# Patient Record
Sex: Female | Born: 1990 | Hispanic: No | Marital: Single | Smoking: Former smoker
Health system: Southern US, Community
[De-identification: ages and names within clinical notes are randomized; demographics above are authoritative.]

## PROBLEM LIST (undated history)

## (undated) DIAGNOSIS — M069 Rheumatoid arthritis, unspecified: Secondary | ICD-10-CM

## (undated) DIAGNOSIS — M3214 Glomerular disease in systemic lupus erythematosus: Secondary | ICD-10-CM

## (undated) DIAGNOSIS — N08 Glomerular disorders in diseases classified elsewhere: Secondary | ICD-10-CM

## (undated) DIAGNOSIS — M329 Systemic lupus erythematosus, unspecified: Secondary | ICD-10-CM

## (undated) DIAGNOSIS — IMO0002 Reserved for concepts with insufficient information to code with codable children: Secondary | ICD-10-CM

## (undated) DIAGNOSIS — M3210 Systemic lupus erythematosus, organ or system involvement unspecified: Secondary | ICD-10-CM

## (undated) HISTORY — DX: Glomerular disease in systemic lupus erythematosus: M32.14

---
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
## 2011-01-09 ENCOUNTER — Telehealth: Payer: Self-pay | Admitting: Internal Medicine

## 2011-01-09 ENCOUNTER — Other Ambulatory Visit: Payer: Self-pay | Admitting: Internal Medicine

## 2011-01-09 DIAGNOSIS — G43909 Migraine, unspecified, not intractable, without status migrainosus: Secondary | ICD-10-CM

## 2011-01-09 MED ORDER — PROPRANOLOL HCL 60 MG PO CP24: 60.0000 mg | ORAL_CAPSULE | Freq: Every day | ORAL | Status: DC

## 2011-01-09 NOTE — Telephone Encounter (Signed)
A user error has taken place: encounter opened in error, closed for administrative reasons.

## 2011-02-11 ENCOUNTER — Ambulatory Visit: Payer: Self-pay | Admitting: Neurology

## 2011-03-27 ENCOUNTER — Ambulatory Visit: Payer: BC Managed Care – PPO | Admitting: Internal Medicine

## 2011-04-01 ENCOUNTER — Ambulatory Visit: Payer: BC Managed Care – PPO | Admitting: Internal Medicine

## 2011-04-01 DIAGNOSIS — Z0289 Encounter for other administrative examinations: Secondary | ICD-10-CM

## 2011-05-08 ENCOUNTER — Ambulatory Visit (INDEPENDENT_AMBULATORY_CARE_PROVIDER_SITE_OTHER): Payer: Self-pay | Admitting: Internal Medicine

## 2011-05-08 ENCOUNTER — Encounter: Payer: Self-pay | Admitting: Internal Medicine

## 2011-05-08 DIAGNOSIS — F411 Generalized anxiety disorder: Secondary | ICD-10-CM

## 2011-05-08 MED ORDER — ALPRAZOLAM 0.25 MG PO TABS: 0.2500 mg | ORAL_TABLET | Freq: Two times a day (BID) | ORAL | Status: AC | PRN

## 2011-05-08 NOTE — Patient Instructions (Signed)
We are going to try a low dose of alprazolam (Zanax) to be taken sparingly for use when you have extreme anxiety  If you find that you need it more than once daily on a regular basis,  You need to consider counselling /therapy and a trial of a daily SSRI like zoloft or celexa.

## 2011-05-08 NOTE — Progress Notes (Signed)
  Subjective:    Patient ID: Candice [name], female    DOB: 11-11-1990, 21 y.o.   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 Jeanella Cara  on cellcept and plaquenil, presents with increase in anxiety due to family's impending  move to [facility] and impending independence from family.  She is looking for part time work getting  ready to start school for [employer] . Having frequent insomnia, panicky feelings brought  on by stress of managing work, school , dtc. No prior history of severe anxiety , but last year in 2012 similar symptoms handled without medication or therapy .Marland Kitchen  Her mother has been her consel until now.  Past Medical History  Diagnosis Date  . 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 Gavin Potters and Dr. Cherylann Ratel   Current Outpatient Prescriptions on File Prior to Visit  Medication Sig Dispense Refill  . famotidine (PEPCID) 20 MG tablet Take 1 tablet (20 mg total) by mouth daily.  30 tablet  11  . hydroxychloroquine (PLAQUENIL) 200 MG tablet Take 2 mg by mouth daily.        . mycophenolate (CELLCEPT) 500 MG tablet Take 3 mg by mouth 2 (two) times daily.        Marland Kitchen omeprazole (PRILOSEC) 20 MG capsule Take 20 mg by mouth daily.        . predniSONE (DELTASONE) 10 MG tablet Take 10 mg by mouth daily.        Marland Kitchen lisinopril (PRINIVIL,ZESTRIL) 5 MG tablet Take 5 mg by mouth daily.        . propranolol (INDERAL LA) 60 MG 24 hr capsule Take 1 capsule (60 mg total) by mouth daily.  30 capsule  3      Review of Systems  Constitutional: Negative for fever, chills, appetite change, fatigue and unexpected weight change.  HENT: Negative for ear pain, congestion, sore throat, trouble swallowing, neck pain, voice change and sinus pressure.   Eyes: Negative for blurred vision and visual disturbance.  Respiratory: Negative for cough, shortness of breath, wheezing and stridor.   Cardiovascular: Negative for chest pain, palpitations and leg swelling.  Gastrointestinal:  Positive for nausea. Negative for vomiting, abdominal pain, diarrhea, constipation, blood in stool, abdominal distention and anal bleeding.  Genitourinary: Negative for dysuria and flank pain.  Musculoskeletal: Negative for myalgias, arthralgias and gait problem.  Skin: Negative for color change and rash.  Neurological: Positive for headaches. Negative for dizziness.  Hematological: Negative for adenopathy. Does not bruise/bleed easily.  Psychiatric/Behavioral: Positive for sleep disturbance. Negative for suicidal ideas and dysphoric mood. The patient is nervous/anxious.        Objective:   Physical Exam  Psychiatric: Her behavior is normal. Judgment and thought content normal. Her mood appears anxious. Cognition and memory are normal.          Assessment & Plan:

## 2011-05-08 NOTE — Assessment & Plan Note (Signed)
spent 30 minutes dicussed her symptoms and various management strategies .  She did not want to start SSRI therapry becauser of fear of side effects of nausea.  She deferred referral to psychologist despite my recommendations.  Trial of alprazolam for symptoms of panic attacks and insomnia.  Follow up with Dr. Dan Humphreys in one nonth

## 2011-06-10 ENCOUNTER — Other Ambulatory Visit: Payer: Self-pay | Admitting: Internal Medicine

## 2011-06-12 ENCOUNTER — Ambulatory Visit: Payer: Self-pay | Admitting: Internal Medicine

## 2011-06-12 DIAGNOSIS — Z0289 Encounter for other administrative examinations: Secondary | ICD-10-CM

## 2011-06-25 ENCOUNTER — Observation Stay: Payer: Self-pay | Admitting: Specialist

## 2011-06-25 LAB — BASIC METABOLIC PANEL
Anion Gap: 13 (ref 7–16)
BUN: 19 mg/dL — ABNORMAL HIGH (ref 7–18)
Chloride: 104 mmol/L (ref 98–107)
Co2: 23 mmol/L (ref 21–32)
Creatinine: 0.98 mg/dL (ref 0.60–1.30)
EGFR (African American): >60
EGFR (Non-African Amer.): >60
Glucose: 177 mg/dL — ABNORMAL HIGH (ref 65–99)

## 2011-06-26 LAB — CBC WITH DIFFERENTIAL/PLATELET
Basophil %: 0.4 %
Eosinophil %: 0.1 %
HCT: 38.7 % (ref 35.0–47.0)
HGB: 13.1 g/dL (ref 12.0–16.0)
Lymphocyte %: 8.7 %
MCH: 29.4 pg (ref 26.0–34.0)
MCHC: 33.8 g/dL (ref 32.0–36.0)
Monocyte #: 1.1 10*3/uL — ABNORMAL HIGH (ref 0.0–0.7)
Monocyte %: 4.9 %
Neutrophil %: 85.9 %
Platelet: 226 10*3/uL (ref 150–440)
WBC: 22.2 10*3/uL — ABNORMAL HIGH (ref 3.6–11.0)

## 2011-06-26 LAB — BASIC METABOLIC PANEL
Anion Gap: 13 (ref 7–16)
BUN: 21 mg/dL — ABNORMAL HIGH (ref 7–18)
Calcium, Total: 8.7 mg/dL — ABNORMAL LOW (ref 9.0–10.7)
Co2: 22 mmol/L (ref 21–32)
Creatinine: 0.93 mg/dL (ref 0.60–1.30)
EGFR (African American): >60
EGFR (Non-African Amer.): >60
Glucose: 127 mg/dL — ABNORMAL HIGH (ref 65–99)
Potassium: 4.2 mmol/L (ref 3.5–5.1)
Sodium: 140 mmol/L (ref 136–145)

## 2011-07-08 ENCOUNTER — Telehealth: Payer: Self-pay

## 2011-07-08 NOTE — Telephone Encounter (Signed)
Part of Dr. Ulyses Jarred schedule for Thurs 07/11/11 is blocked for now, but he will be able to see this [name] that day. I called Candice [name] and had to [facility]. I called Candice [name] and offered her ov with BQ for 07/11/11 and she states that her cough is getting much better, and she does not want to make Candice appt until she talks with Candice referring doc to see if he feels that pulm consult still necc. She states that she will call back and let us know. Until then I will keep Candice schedule closed in case she does need appt. Will await a call back.

## 2011-07-08 NOTE — Telephone Encounter (Signed)
Verlon Au can you please ask Dr. Kendrick Fries if this pt can be worked in this week? I will also send him the message.  Thanks. Carron Curie, CMA

## 2011-07-08 NOTE — Telephone Encounter (Signed)
[id number] x1 

## 2011-07-08 NOTE — Telephone Encounter (Signed)
The schedule is full right now.  Why does she need to be worked in?  What is the pressing need?

## 2011-07-08 NOTE — Telephone Encounter (Signed)
I spoke with Andrey Campanile and advised we can offer appt in [place]. She states she is going to call the pt and speak with her and have her call the office to set the appt if an ov in  is possible. I will leave message open to await their call. Carron Curie, CMA

## 2011-07-08 NOTE — Telephone Encounter (Signed)
[id number]. Per TD we can offer an appt in [place]. Carron Curie, CMA

## 2011-07-08 NOTE — Telephone Encounter (Signed)
Sandy calling back

## 2011-07-08 NOTE — Telephone Encounter (Signed)
Sandy from [facility] returned call Leanora Ivanoff

## 2011-07-09 NOTE — Telephone Encounter (Signed)
Spoke with pt and [place]. She states that she spoke with the pt's Mother this am and Mother states that the pt;s cough is gone and she does not feel appt is needed. Andrey Campanile states that she will call them back for referral if the cough returns. Nothing further needed so will close this encounter.

## 2011-09-06 ENCOUNTER — Ambulatory Visit: Payer: Self-pay | Admitting: Internal Medicine

## 2011-10-23 ENCOUNTER — Telehealth: Payer: Self-pay | Admitting: Internal Medicine

## 2011-10-23 IMAGING — CT CT HEAD WITHOUT CONTRAST
2 series · 15 of 30 positions shown, 19 images · non-contrast
Comparison: none

REASON FOR EXAM: ams seizure hx SLE
COMMENTS:

PROCEDURE:     CT  - CT HEAD WITHOUT CONTRAST  - June 17, 2010  [DATE]
RESULT:     Comparison:  None
TECHNIQUE: Multiple axial images from the foramen magnum to the vertex were
obtained without IV contrast.

[Series 2: without · axial · non-contrast · 0.40mm/px · z∈[+355,+480]mm · 13 of 31 slices shown, 17 images]
[im 3/31  brain]
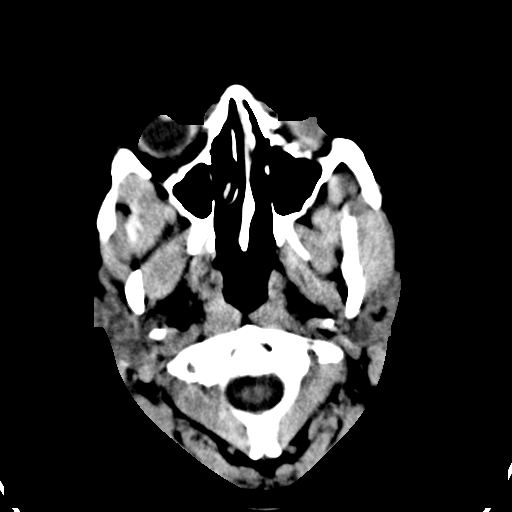
[im 3/31  bone]
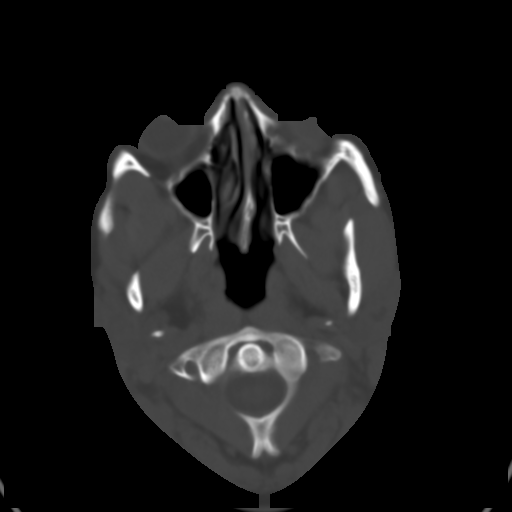
[im 5/31  brain]
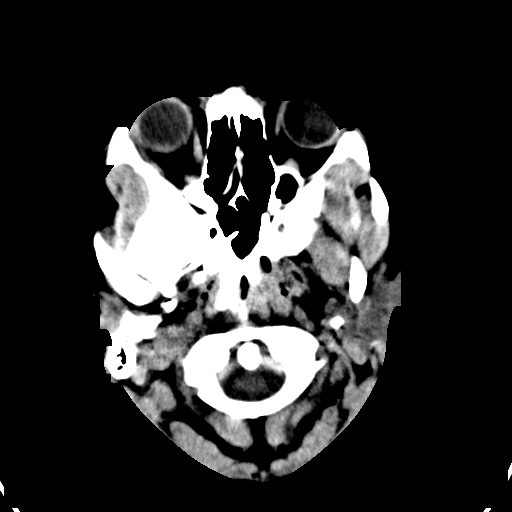
[im 7/31  brain]
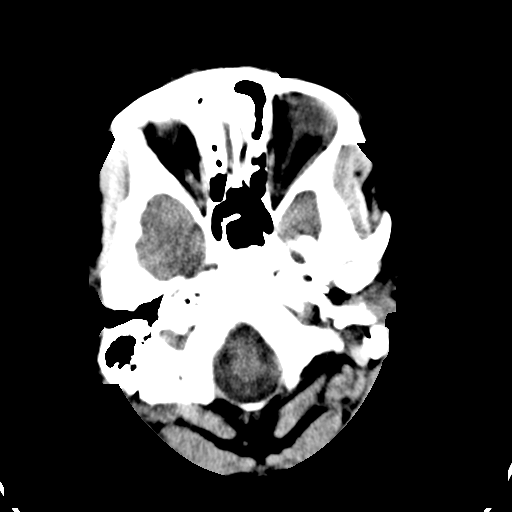
[im 9/31  brain]
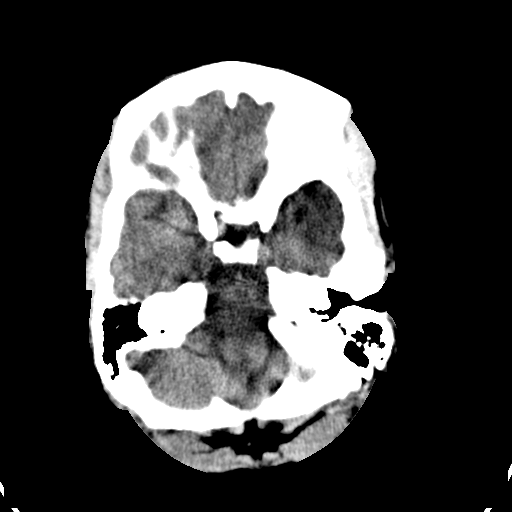
[im 11/31  brain]
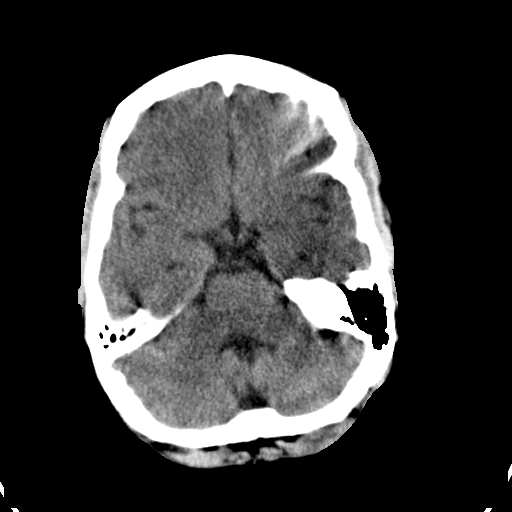
[im 11/31  bone]
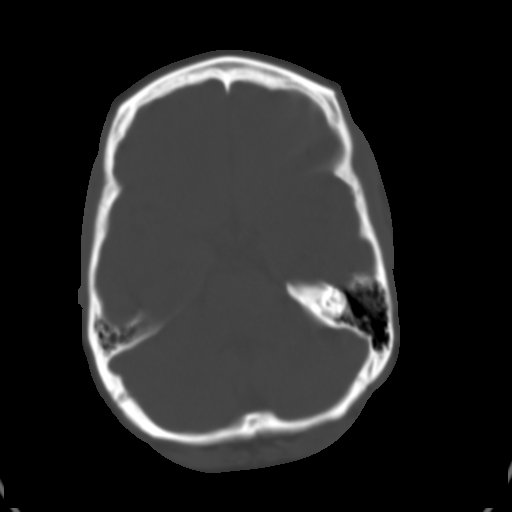
[im 13/31  brain]
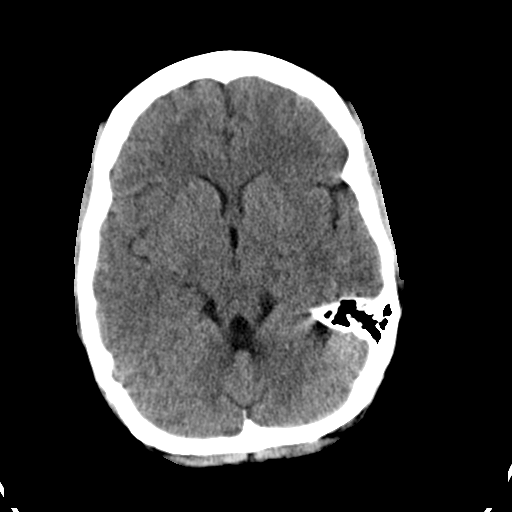
[im 16/31  brain]
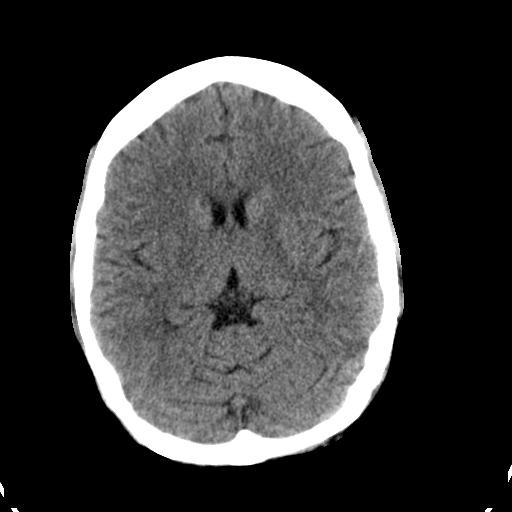
[im 18/31  brain]
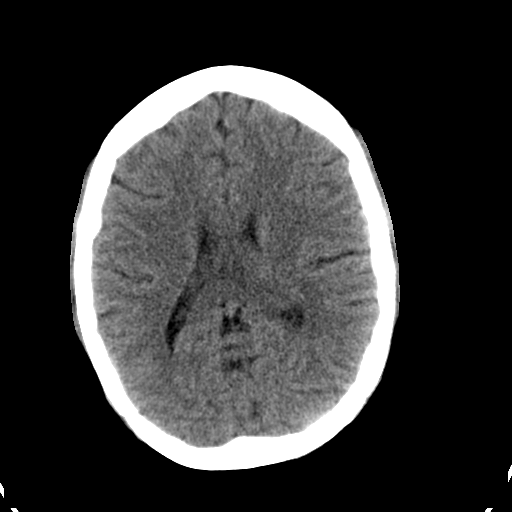
[im 20/31  brain]
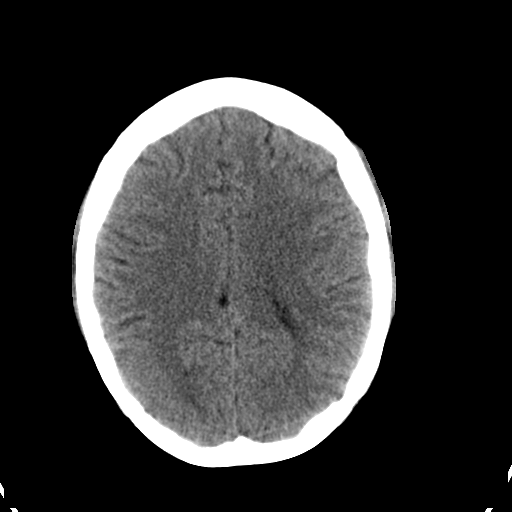
[im 20/31  bone]
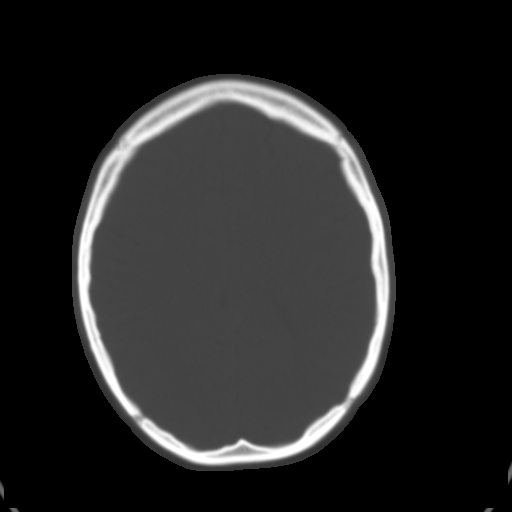
[im 22/31  brain]
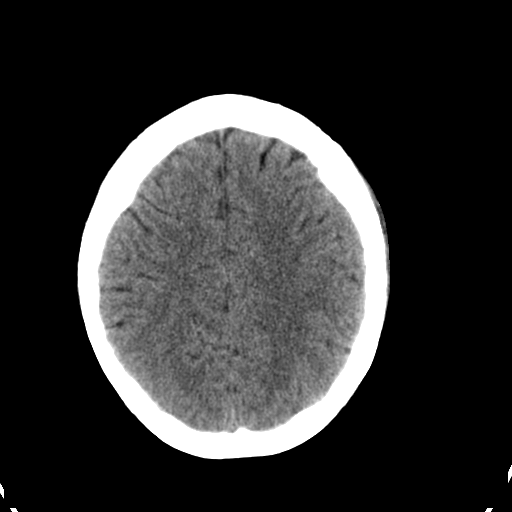
[im 24/31  brain]
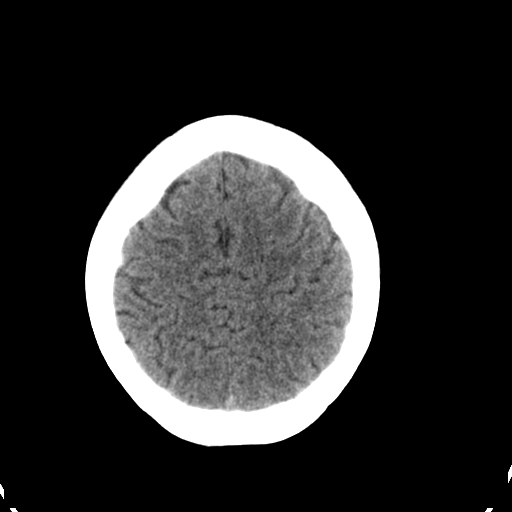
[im 26/31  brain]
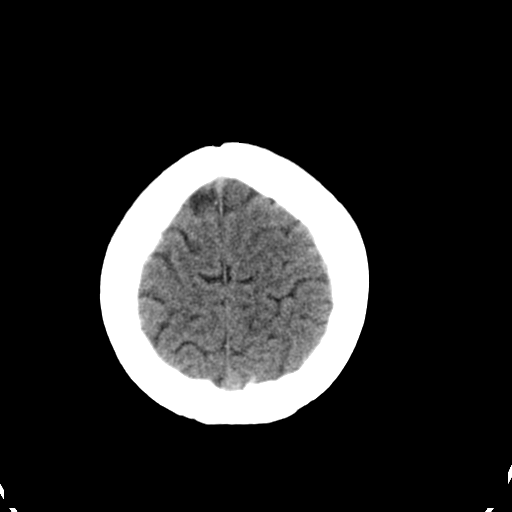
[im 28/31  brain]
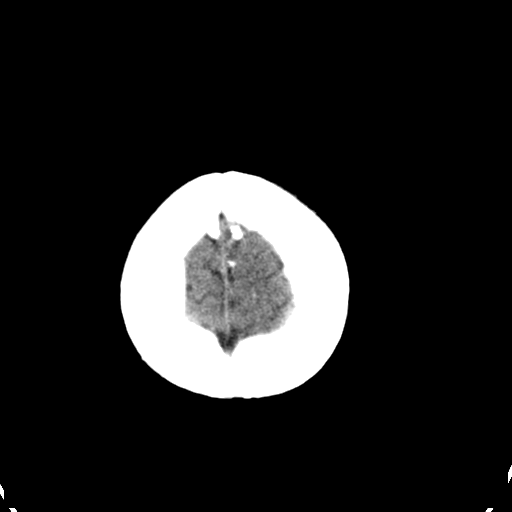
[im 28/31  bone]
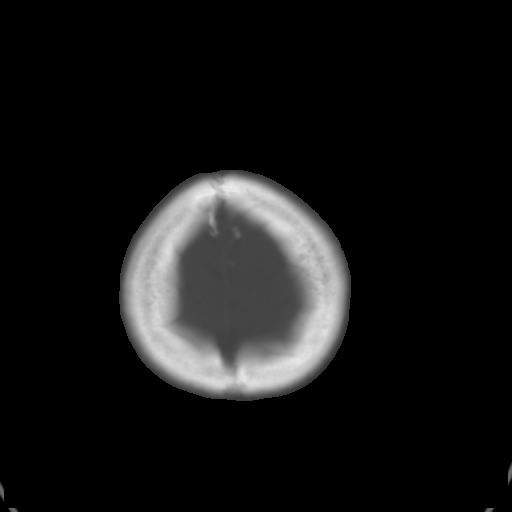

[Series 3: bone · axial · 0.40mm/px · z∈[+355,+375]mm · 2 of 31 slices shown]
[im 3/31  bone]
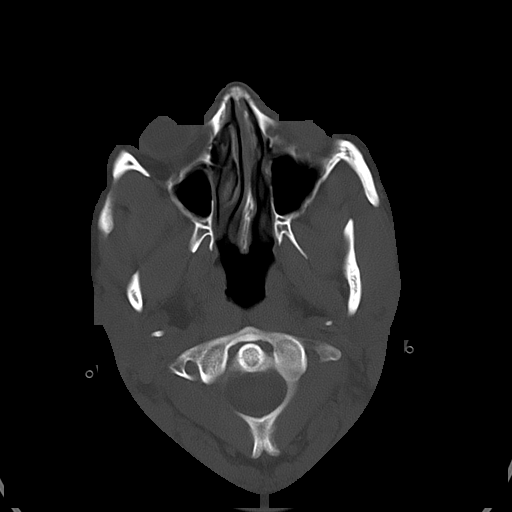
[im 7/31  bone]
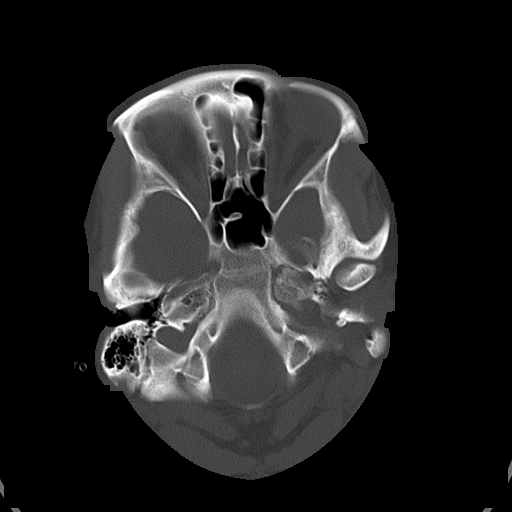

[15 of 30 positions shown; findings below may reference images not displayed]

FINDINGS: On a single image only, there is a round area of hyperdensity in the
anterior periphery of the left frontal lobe which measures 1.3 x 0.8 cm.
This is seen on image 14. While it is possible this is related to streak
artifact, it is concerning for a small extra-axial mass, such as a
meningioma. There does not appear to be adjacent edema.

No evidence of midline shift, acute cortical-based infarction, or
intracranial hemorrhage. The basilar cisterns are patent.

The osseous structures are unremarkable.
IMPRESSION: 1. No acute cranial hemorrhage.
2. Small area of hyperdensity at the periphery of the left frontal lobe
could represent artifact, but is concerning for a small extra-axial mass
such as a meningioma. Further evaluation with contrast-enhanced brain MRI is
recommended.

This was discussed with Dr. Daryl Hodo at 6053 hours 06/17/2010.

## 2011-10-23 NOTE — Telephone Encounter (Signed)
Caller: Candice [name]/Patient; PCP: Ronna Polio; CB#: [phone]; ; Call regarding Swelling in Legs, Feet and Hands; Pt has decreased her oral steriods for lupus nephritis per her nephrologist. She notes increased edema in her feet, legs and hands for " several days." She did speak to the on call nephrologist on 10/21/11 who was to call in a new rx for steroids but the medication is not at the pharmacy. She ? if an rx can be called in for her. [facility]. Emergent sx r/o. Advised to call back to her nephrologist for the medication. If she cannot get an rx to [facility] for an eval. Med ? Protocol.

## 2012-01-04 ENCOUNTER — Encounter (HOSPITAL_COMMUNITY): Payer: Self-pay

## 2012-01-04 ENCOUNTER — Emergency Department (HOSPITAL_COMMUNITY)
Admission: EM | Admit: 2012-01-04 | Discharge: 2012-01-04 | Disposition: A | Discharge status: Home / Self Care | Payer: 59 | Source: Home / Self Care

## 2012-01-04 DIAGNOSIS — M25559 Pain in unspecified hip: Secondary | ICD-10-CM

## 2012-01-04 DIAGNOSIS — M25551 Pain in right hip: Secondary | ICD-10-CM

## 2012-01-04 DIAGNOSIS — M069 Rheumatoid arthritis, unspecified: Secondary | ICD-10-CM

## 2012-01-04 HISTORY — DX: Rheumatoid arthritis, unspecified: M06.9

## 2012-01-04 HISTORY — DX: Glomerular disorders in diseases classified elsewhere: M32.10

## 2012-01-04 HISTORY — DX: Glomerular disorders in diseases classified elsewhere: N08

## 2012-01-04 MED ORDER — TRIAMCINOLONE ACETONIDE 40 MG/ML IJ SUSP
60.0000 mg | Freq: Once | INTRAMUSCULAR | Status: AC
  Administered 2012-01-04: 60 mg via INTRAMUSCULAR

## 2012-01-04 MED ORDER — TRIAMCINOLONE ACETONIDE 40 MG/ML IJ SUSP
INTRAMUSCULAR | Status: AC
  Filled 2012-01-04: qty 5

## 2012-01-04 MED ORDER — PREDNISONE 10 MG PO KIT: PACK | ORAL | Status: DC

## 2012-01-04 MED ORDER — TRIAMCINOLONE ACETONIDE 40 MG/ML IJ SUSP: 40.0000 mg | Freq: Once | INTRAMUSCULAR | Status: DC

## 2012-01-04 NOTE — ED Notes (Signed)
Pt has history of lupus nephritis and RA and she has been having severe rt hip pain for one month and her lt hip is starting to hurt also.  She has been taking prednisone for last six days without improvement.  She has appointment with rheumatologist in a few weeks, but is unable to tolerate the pain.

## 2012-01-04 NOTE — ED Provider Notes (Signed)
History     CSN: [id number]  Arrival date & time 01/04/12  1329   First MD Initiated Contact with Patient 01/04/12 1520      No chief complaint on file.   (Consider location/radiation/quality/duration/timing/severity/associated sxs/prior treatment) HPI Comments: Hx of autoimmune disorders including Lupus and RA. She has a rheumatologist but could not get in for another 10 days. Pain is located R lateral hip and anterior groin across the inguinal fold at the origin of the quads. Very painful to lift leg, and to ambulate, also marked tenderness to these areas. No hx of trama.   Patient is a 21 y.o. female presenting with hip pain.  Hip Pain This is a recurrent problem. The current episode started more than 2 days ago. The problem occurs constantly. The problem has been gradually worsening. Pertinent negatives include no chest pain, no abdominal pain, no headaches and no shortness of breath. The symptoms are aggravated by walking and standing. The symptoms are relieved by position and lying down. She has tried a warm compress for the symptoms. The treatment provided mild relief.    Past Medical History  Diagnosis Date  . 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 Gavin Potters and Dr. Cherylann Ratel    Past Surgical History  Procedure Date  . Renal biopsy 06/2010    Family History  Problem Relation Age of Onset  . Hypertension Father   . Scleroderma Maternal Grandmother     History  Substance Use Topics  . Smoking status: Current Some Day Smoker -- 0.2 packs/day    Types: Cigarettes  . Smokeless tobacco: Not on file  . Alcohol Use: No    OB History    Grav Para Term Preterm Abortions TAB SAB Ect Mult Living   0 0 0 0 0 0 0 0 0 0       Review of Systems  Constitutional: Positive for activity change. Negative for fever.  Respiratory: Negative.  Negative for shortness of breath.   Cardiovascular: Negative for chest pain.  Gastrointestinal: Negative for abdominal pain.    Genitourinary: Negative.   Musculoskeletal: Positive for arthralgias and gait problem. Negative for back pain and joint swelling.  Skin: Negative.   Neurological: Negative for dizziness and headaches.  Hematological: Negative for adenopathy.  Psychiatric/Behavioral: The patient is nervous/anxious.     Allergies  Review of patient's allergies indicates no known allergies.  Home Medications   Current Outpatient Rx  Name Route Sig Dispense Refill  . ALPRAZOLAM 0.25 MG PO TABS  take 1 tablet by mouth twice a day if needed for sleep or anxiety 60 tablet 1  . CYCLOBENZAPRINE HCL 10 MG PO TABS  Take one by mouth as needed for migraines     . ENALAPRIL MALEATE 5 MG PO TABS Oral Take 5 mg by mouth daily.      Marland Kitchen FAMOTIDINE 20 MG PO TABS Oral Take 1 tablet (20 mg total) by mouth daily. 30 tablet 11  . HYDROXYCHLOROQUINE SULFATE 200 MG PO TABS Oral Take 2 mg by mouth daily.      Marland Kitchen LISINOPRIL 5 MG PO TABS Oral Take 5 mg by mouth daily.      Marland Kitchen MYCOPHENOLATE MOFETIL 500 MG PO TABS Oral Take 3 mg by mouth 2 (two) times daily.      Marland Kitchen OMEPRAZOLE 20 MG PO CPDR Oral Take 20 mg by mouth daily.      Marland Kitchen PREDNISONE 10 MG PO TABS Oral Take 10 mg by mouth daily.      Marland Kitchen  PROPRANOLOL HCL 60 MG PO CP24 Oral Take 1 capsule (60 mg total) by mouth daily. 30 capsule 3    There were no vitals taken for this visit.  Physical Exam  Constitutional: She appears well-developed and well-nourished.  Neck: Normal range of motion. Neck supple.  Pulmonary/Chest: Effort normal.  Musculoskeletal: She exhibits tenderness. She exhibits no edema.       See HPI for exam  Skin: Skin is warm and dry.    ED Course  Procedures (including critical care time)  Labs Reviewed - No data to display No results found.   No diagnosis found.    MDM  Acute on chronic hip pain associated with myalgia or tendiniits. Kenalog 40mg  IM and Medrol pack starting tomorrow. Will see her rheumatologist in 10 d.           Hayden Rasmussen, NP 01/04/12 2119

## 2012-01-05 NOTE — ED Provider Notes (Signed)
Medical screening examination/treatment/procedure(s) were performed by resident physician or non-physician practitioner and as supervising physician I was immediately available for consultation/collaboration.   Jaquelin Meaney DOUGLAS MD.    Elisabeth Strom D Ladeja Pelham, MD 01/05/12 1717 

## 2014-01-02 ENCOUNTER — Encounter (HOSPITAL_COMMUNITY): Payer: Self-pay | Admitting: Emergency Medicine

## 2014-01-02 ENCOUNTER — Emergency Department (HOSPITAL_COMMUNITY): Payer: 59

## 2014-01-02 ENCOUNTER — Emergency Department (HOSPITAL_COMMUNITY)
Admission: EM | Admit: 2014-01-02 | Discharge: 2014-01-02 | Disposition: A | Discharge status: Home / Self Care | Payer: 59 | Attending: Emergency Medicine | Admitting: Emergency Medicine

## 2014-01-02 DIAGNOSIS — N058 Unspecified nephritic syndrome with other morphologic changes: Secondary | ICD-10-CM | POA: Insufficient documentation

## 2014-01-02 DIAGNOSIS — Z8679 Personal history of other diseases of the circulatory system: Secondary | ICD-10-CM | POA: Insufficient documentation

## 2014-01-02 DIAGNOSIS — Z79899 Other long term (current) drug therapy: Secondary | ICD-10-CM | POA: Insufficient documentation

## 2014-01-02 DIAGNOSIS — M329 Systemic lupus erythematosus, unspecified: Secondary | ICD-10-CM | POA: Insufficient documentation

## 2014-01-02 DIAGNOSIS — F17200 Nicotine dependence, unspecified, uncomplicated: Secondary | ICD-10-CM | POA: Diagnosis not present

## 2014-01-02 DIAGNOSIS — R000 Tachycardia, unspecified: Secondary | ICD-10-CM | POA: Diagnosis not present

## 2014-01-02 DIAGNOSIS — Z8659 Personal history of other mental and behavioral disorders: Secondary | ICD-10-CM | POA: Diagnosis not present

## 2014-01-02 DIAGNOSIS — M069 Rheumatoid arthritis, unspecified: Secondary | ICD-10-CM | POA: Diagnosis not present

## 2014-01-02 DIAGNOSIS — R079 Chest pain, unspecified: Secondary | ICD-10-CM

## 2014-01-02 DIAGNOSIS — M3214 Glomerular disease in systemic lupus erythematosus: Secondary | ICD-10-CM

## 2014-01-02 LAB — BASIC METABOLIC PANEL
Anion gap: 13 (ref 5–15)
BUN: 11 mg/dL (ref 6–23)
CO2: 23 mEq/L (ref 19–32)
CREATININE: 0.71 mg/dL (ref 0.50–1.10)
Calcium: 9.1 mg/dL (ref 8.4–10.5)
Chloride: 106 mEq/L (ref 96–112)
GFR calc Af Amer: >90 mL/min (ref >90)
GFR calc non Af Amer: >90 mL/min (ref >90)
Glucose, Bld: 107 mg/dL — ABNORMAL HIGH (ref 70–99)
Potassium: 3.9 mEq/L (ref 3.7–5.3)
Sodium: 142 mEq/L (ref 137–147)

## 2014-01-02 LAB — CBC WITH DIFFERENTIAL/PLATELET
BASOS ABS: 0 10*3/uL (ref 0.0–0.1)
Basophils Relative: 0 % (ref 0–1)
Eosinophils Absolute: 0.1 10*3/uL (ref 0.0–0.7)
Eosinophils Relative: 1 % (ref 0–5)
HCT: 39.8 % (ref 36.0–46.0)
HEMOGLOBIN: 13.4 g/dL (ref 12.0–15.0)
LYMPHS PCT: 11 % — AB (ref 12–46)
Lymphs Abs: 1.1 10*3/uL (ref 0.7–4.0)
MCH: 29.5 pg (ref 26.0–34.0)
MCHC: 33.7 g/dL (ref 30.0–36.0)
MCV: 87.5 fL (ref 78.0–100.0)
MONO ABS: 0.7 10*3/uL (ref 0.1–1.0)
Monocytes Relative: 7 % (ref 3–12)
Neutro Abs: 8.1 10*3/uL — ABNORMAL HIGH (ref 1.7–7.7)
Neutrophils Relative %: 81 % — ABNORMAL HIGH (ref 43–77)
Platelets: 243 10*3/uL (ref 150–400)
RBC: 4.55 MIL/uL (ref 3.87–5.11)
RDW: 15.5 % (ref 11.5–15.5)
WBC: 10 10*3/uL (ref 4.0–10.5)

## 2014-01-02 LAB — D-DIMER, QUANTITATIVE (NOT AT ARMC)

## 2014-01-02 LAB — TROPONIN I

## 2014-01-02 MED ORDER — KETOROLAC TROMETHAMINE 15 MG/ML IJ SOLN
15.0000 mg | Freq: Once | INTRAMUSCULAR | Status: AC | PRN
  Administered 2014-01-02: 15 mg via INTRAVENOUS
  Filled 2014-01-02: qty 1

## 2014-01-02 MED ORDER — MORPHINE SULFATE 4 MG/ML IJ SOLN
6.0000 mg | Freq: Once | INTRAMUSCULAR | Status: AC
  Administered 2014-01-02: 6 mg via INTRAVENOUS
  Filled 2014-01-02: qty 2

## 2014-01-02 MED ORDER — SODIUM CHLORIDE 0.9 % IV BOLUS (SEPSIS)
500.0000 mL | Freq: Once | INTRAVENOUS | Status: AC
  Administered 2014-01-02: 500 mL via INTRAVENOUS

## 2014-01-02 MED ORDER — PREDNISONE 20 MG PO TABS: ORAL_TABLET | ORAL | Status: DC

## 2014-01-02 NOTE — ED Notes (Signed)
Per pt sts that she has been having chest pain with movement and breathing since last night. sts she has lupus and hx of inflammation in her lungs and it feels the same.

## 2014-01-02 NOTE — ED Notes (Signed)
I gave the patient a cup of ice and a ginger-ale. I gave her visitor a cup of ice and a coke.

## 2014-01-02 NOTE — Discharge Instructions (Signed)
If you were given medicines take as directed.  If you are on coumadin or contraceptives realize their levels and effectiveness is altered by many different medicines.  If you have any reaction (rash, tongues swelling, other) to the medicines stop taking and see a physician.   Please follow up as directed and return to the ER or see a physician for new or worsening symptoms.  Thank you. Filed Vitals:   01/02/14 1345 01/02/14 1400 01/02/14 1415 01/02/14 1430  BP: 118/73 115/73 109/64 107/76  Pulse: 88 90 82 90  Temp:      Resp:      SpO2: 100% 100% 100% 100%    Chest Pain (Nonspecific) It is often hard to give a specific diagnosis for the cause of chest pain. There is always a chance that your pain could be related to something serious, such as a heart attack or a blood clot in the lungs. You need to follow up with your health care provider for further evaluation. CAUSES   Heartburn.  Pneumonia or bronchitis.  Anxiety or stress.  Inflammation around your heart (pericarditis) or lung (pleuritis or pleurisy).  A blood clot in the lung.  A collapsed lung (pneumothorax). It can develop suddenly on its own (spontaneous pneumothorax) or from trauma to the chest.  Shingles infection (herpes zoster virus). The chest wall is composed of bones, muscles, and cartilage. Any of these can be the source of the pain.  The bones can be bruised by injury.  The muscles or cartilage can be strained by coughing or overwork.  The cartilage can be affected by inflammation and become sore (costochondritis). DIAGNOSIS  Lab tests or other studies may be needed to find the cause of your pain. Your health care provider may have you take a test called an ambulatory electrocardiogram (ECG). An ECG records your heartbeat patterns over a 24-hour period. You may also have other tests, such as:  Transthoracic echocardiogram (TTE). During echocardiography, sound waves are used to evaluate how blood flows through  your heart.  Transesophageal echocardiogram (TEE).  Cardiac monitoring. This allows your health care provider to monitor your heart rate and rhythm in real time.  Holter monitor. This is a portable device that records your heartbeat and can help diagnose heart arrhythmias. It allows your health care provider to track your heart activity for several days, if needed.  Stress tests by exercise or by giving medicine that makes the heart beat faster. TREATMENT   Treatment depends on what may be causing your chest pain. Treatment may include:  Acid blockers for heartburn.  Anti-inflammatory medicine.  Pain medicine for inflammatory conditions.  Antibiotics if an infection is present.  You may be advised to change lifestyle habits. This includes stopping smoking and avoiding alcohol, caffeine, and chocolate.  You may be advised to keep your head raised (elevated) when sleeping. This reduces the chance of acid going backward from your stomach into your esophagus. Most of the time, nonspecific chest pain will improve within 2-3 days with rest and mild pain medicine.  HOME CARE INSTRUCTIONS   If antibiotics were prescribed, take them as directed. Finish them even if you start to feel better.  For the next few days, avoid physical activities that bring on chest pain. Continue physical activities as directed.  Do not use any tobacco products, including cigarettes, chewing tobacco, or electronic cigarettes.  Avoid drinking alcohol.  Only take medicine as directed by your health care provider.  Follow your health care provider's suggestions  for further testing if your chest pain does not go away.  Keep any follow-up appointments you made. If you do not go to an appointment, you could develop lasting (chronic) problems with pain. If there is any problem keeping an appointment, call to reschedule. SEEK MEDICAL CARE IF:   Your chest pain does not go away, even after treatment.  You have a  rash with blisters on your chest.  You have a fever. SEEK IMMEDIATE MEDICAL CARE IF:   You have increased chest pain or pain that spreads to your arm, neck, jaw, back, or abdomen.  You have shortness of breath.  You have an increasing cough, or you cough up blood.  You have severe back or abdominal pain.  You feel nauseous or vomit.  You have severe weakness.  You faint.  You have chills. This is an emergency. Do not wait to see if the pain will go away. Get medical help at once. Call your local emergency services (911 in [place] Do not drive yourself to the hospital. MAKE SURE YOU:   Understand these instructions.  Will watch your condition.  Will get help right away if you are not doing well or get worse. Document Released: 01/30/2005 Document Revised: 04/27/2013 Document Reviewed: 11/26/2007 [facility] [employer] Patient Information 2015 [place], [place]. This information is not intended to replace advice given to you by your health care provider. Make sure you discuss any questions you have with your health care provider.

## 2014-01-02 NOTE — ED Provider Notes (Signed)
CSN: [id number]     Arrival date & time 01/02/14  1032 History   First MD Initiated Contact with Patient 01/02/14 1129     Chief Complaint  Patient presents with  . Lupus  . 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 Started last night, patient has had pleuritis once in the past felt similar. Patient denies recent surgeries, unilateral leg symptoms, active cancer, blood clot history. Patient does have lupus history. No exertional symptoms or diaphoresis. No cardiac history known. No pericardial effusion in the past. Patient does take her lupus medicines as directed and follows up outpatient.  Patient is a 23 y.o. female presenting with chest pain. The history is provided by the patient.  Chest Pain Associated symptoms: shortness of breath   Associated symptoms: no abdominal pain, no back pain, no fever, no headache and not vomiting     Past Medical History  Diagnosis Date  . 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 Gavin Potters and Dr. Cherylann Ratel  . Lupoid nephritis   . Lupoid nephritis   . Lupoid nephritis   . Lupoid nephritis   . Rheumatoid arthritis(714.0)    Past Surgical History  Procedure Laterality Date  . Renal biopsy  06/2010   Family History  Problem Relation Age of Onset  . Hypertension Father   . Scleroderma Maternal Grandmother    History  Substance Use Topics  . Smoking status: Current Some Day Smoker -- 0.25 packs/day    Types: Cigarettes  . Smokeless tobacco: Not on file  . Alcohol Use: Yes   OB History   Grav Para Term Preterm Abortions TAB SAB Ect Mult Living   0 0 0 0 0 0 0 0 0 0      Review of Systems  Constitutional: Negative for fever and chills.  HENT: Negative for congestion.   Eyes: Negative for visual  disturbance.  Respiratory: Positive for shortness of breath.   Cardiovascular: Positive for chest pain.  Gastrointestinal: Negative for vomiting and abdominal pain.  Genitourinary: Negative for dysuria and flank pain.  Musculoskeletal: Negative for back pain, neck pain and neck stiffness.  Skin: Negative for rash.  Neurological: Negative for light-headedness and headaches.      Allergies  Review of patient's allergies indicates no known allergies.  Home Medications   Prior to Admission medications   Medication Sig Start Date End Date Taking? Authorizing Provider  Calcium Carbonate-Vit D-Min (CALCIUM 1200 PO) Take 1,200 mg by mouth 2 (two) times daily.   Yes Historical Provider, MD  enalapril (VASOTEC) 5 MG tablet Take 5 mg by mouth daily.    Yes Historical Provider, MD  famotidine (PEPCID) 20 MG tablet Take 20 mg by mouth 2 (two) times daily.   Yes Historical Provider, MD  folic acid (FOLVITE) 1 MG tablet Take 1 mg by mouth daily.   Yes Historical Provider, MD  HYDROcodone-acetaminophen (NORCO) 10-325 MG per tablet Take 1 tablet by mouth every 6 (six) hours as needed for moderate pain.   Yes Historical Provider, MD  hydroxychloroquine (PLAQUENIL) 200 MG tablet Take 2 mg by mouth 2 (two) times daily.    Yes Historical Provider, MD  ibuprofen (ADVIL,MOTRIN) 200 MG tablet Take 400-800 mg by mouth every 6 (six) hours as needed for mild pain.   Yes Historical Provider, MD  methotrexate (RHEUMATREX) 2.5 MG tablet Take 15  mg by mouth once a week. Caution:Chemotherapy. Protect from light.   Yes Historical Provider, MD  mycophenolate (CELLCEPT) 500 MG tablet Take 1,500 mg by mouth 2 (two) times daily.    Yes Historical Provider, MD  oxyCODONE-acetaminophen (PERCOCET/ROXICET) 5-325 MG per tablet Take 1 tablet by mouth every 4 (four) hours as needed for severe pain.   Yes Historical Provider, MD  propranolol (INDERAL LA) 60 MG 24 hr capsule Take 1 capsule (60 mg total) by mouth daily. 01/09/11  01/04/12  Shelia Media, MD   BP 144/84  Pulse 106  Temp(Src) 98.8 F (37.1 C)  Resp 18  SpO2 100% Physical Exam  Nursing note and vitals reviewed. Constitutional: She is oriented to person, place, and time. She appears well-developed and well-nourished.  HENT:  Head: Normocephalic and atraumatic.  Eyes: Conjunctivae are normal. Right eye exhibits no discharge. Left eye exhibits no discharge.  Neck: Normal range of motion. Neck supple. No tracheal deviation present.  Cardiovascular: Regular rhythm.  Tachycardia present.   Pulmonary/Chest: Effort normal and breath sounds normal.  Abdominal: Soft. She exhibits no distension. There is no tenderness. There is no guarding.  Musculoskeletal: She exhibits no edema and no tenderness.  Neurological: She is alert and oriented to person, place, and time.  Skin: Skin is warm. No rash noted.  Psychiatric: She has a normal mood and affect.    ED Course  Procedures (including critical care time) Labs Review Labs Reviewed  CBC WITH DIFFERENTIAL - Abnormal; Notable for the following:    Neutrophils Relative % 81 (*)    Neutro Abs 8.1 (*)    Lymphocytes Relative 11 (*)    All other components within normal limits  BASIC METABOLIC PANEL - Abnormal; Notable for the following:    Glucose, Bld 107 (*)    All other components within normal limits  D-DIMER, QUANTITATIVE  TROPONIN I    Imaging Review Dg Chest 2 View  01/02/2014   CLINICAL DATA:  Chest pain.  Shortness of breath.  EXAM: CHEST  2 VIEW  COMPARISON:  None.  FINDINGS: Mediastinum and hilar structures normal. Lungs are clear. Heart size normal. No pleural effusion or pneumothorax. No acute osseous abnormality. 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  IMPRESSION: 1. No acute cardiopulmonary disease. 2. 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   Electronically Signed   By: Maisie Fus  Register   On: 01/02/2014 11:21     EKG Interpretation   Date/Time:   Sunday January 02 2014 14:57:21 EDT Ventricular Rate:  83 PR Interval:  163 QRS Duration: 68 QT Interval:  344 QTC Calculation: 404 R Axis:   45 Text Interpretation:  Sinus rhythm Low voltage, precordial leads Baseline  wander in lead(s) V3 No recipricol changes. No old ekg.  Confirmed by  Jodi Mourning  MD, Draper Gallon ([id number]) on 01/02/2014 3:00:05 PM      MDM   Final diagnoses:  Chest pain, unspecified  Lupus nephritis   Patient very low risk cardiac, plan for one troponin, EKG/ Pleuritic chest pain, differential pleuritis versus pulmonary embolism, patient low risk plan for d-dimer, pain medicines, chest x-ray. Patient has outpatient followup.  Patient improved on recheck, troponin and d-dimer negative. Followup outpatient. Chest x-ray reviewed no acute findings. Prednisone trial for home.  Results and differential diagnosis were discussed with the patient/parent/guardian. Close follow up outpatient was discussed, comfortable with the plan.   Medications  sodium chloride 0.9 % bolus 500 mL (not administered)  morphine 4 MG/ML injection  6 mg (not administered)    Filed Vitals:   01/02/14 1345 01/02/14 1400 01/02/14 1415 01/02/14 1430  BP: 118/73 115/73 109/64 107/76  Pulse: 88 90 82 90  Temp:      Resp:      SpO2: 100% 100% 100% 100%         Enid Skeens, MD 01/02/14 1507

## 2014-03-02 ENCOUNTER — Ambulatory Visit (INDEPENDENT_AMBULATORY_CARE_PROVIDER_SITE_OTHER): Payer: 59 | Admitting: Family Medicine

## 2014-03-02 VITALS — BP 126/84 | HR 96 | Temp 99.0°F | Resp 16 | Ht 62.5 in | Wt 156.6 lb

## 2014-03-02 DIAGNOSIS — M3214 Glomerular disease in systemic lupus erythematosus: Secondary | ICD-10-CM

## 2014-03-02 DIAGNOSIS — J069 Acute upper respiratory infection, unspecified: Secondary | ICD-10-CM

## 2014-03-02 DIAGNOSIS — R05 Cough: Secondary | ICD-10-CM

## 2014-03-02 DIAGNOSIS — R112 Nausea with vomiting, unspecified: Secondary | ICD-10-CM

## 2014-03-02 DIAGNOSIS — J019 Acute sinusitis, unspecified: Secondary | ICD-10-CM

## 2014-03-02 DIAGNOSIS — R059 Cough, unspecified: Secondary | ICD-10-CM

## 2014-03-02 DIAGNOSIS — I159 Secondary hypertension, unspecified: Secondary | ICD-10-CM

## 2014-03-02 LAB — POCT CBC
Granulocyte percent: 73.8 %G (ref 37–80)
HEMATOCRIT: 42.1 % (ref 37.7–47.9)
Hemoglobin: 13.6 g/dL (ref 12.2–16.2)
LYMPH, POC: 1.3 (ref 0.6–3.4)
MCH: 28 pg (ref 27–31.2)
MCHC: 32.3 g/dL (ref 31.8–35.4)
MCV: 86.7 fL (ref 80–97)
MID (cbc): 0.6 (ref 0–0.9)
MPV: 8.5 fL (ref 0–99.8)
POC GRANULOCYTE: 5.5 (ref 2–6.9)
POC LYMPH PERCENT: 17.7 %L (ref 10–50)
POC MID %: 8.5 %M (ref 0–12)
Platelet Count, POC: 239 10*3/uL (ref 142–424)
RBC: 4.86 M/uL (ref 4.04–5.48)
RDW, POC: 14.9 %
WBC: 7.4 10*3/uL (ref 4.6–10.2)

## 2014-03-02 MED ORDER — AZITHROMYCIN 250 MG PO TABS
ORAL_TABLET | ORAL | Status: DC
Start: 2014-03-02 — End: 2014-03-02

## 2014-03-02 MED ORDER — AZITHROMYCIN 250 MG PO TABS: ORAL_TABLET | ORAL | Status: DC

## 2014-03-02 MED ORDER — ONDANSETRON 4 MG PO TBDP: 4.0000 mg | ORAL_TABLET | Freq: Three times a day (TID) | ORAL | Status: DC | PRN

## 2014-03-02 MED ORDER — ENALAPRIL MALEATE 5 MG PO TABS: 5.0000 mg | ORAL_TABLET | Freq: Every day | ORAL | Status: AC

## 2014-03-02 NOTE — Progress Notes (Addendum)
Subjective:    Patient ID: Candice [name], female    DOB: Apr 09, 1991, 23 y.o.   MRN: [id number] This chart was scribed for Meredith Staggers, MD by Julian Hy, ED Scribe. The patient was seen in Room 8. The patient's care was started at 8:02 PM.   03/02/2014  Chief Complaint  Patient presents with  . Nausea    nausea, vomiting, cough with sputum x 2-3 days.     HPI HPI Comments: Pt has a hx of Lupus nephritis. She is on medications for lupus including: Plaquenil, Methotrexate, and Cellcept.   Candice [name] is a 23 y.o. female who presents to the [facility] complaining of new, moderate, constant and gradually worsening congestion onset 2-3 days ago. Pt complains of associated cough, bad taste, nausea, vomiting, post-nasal drip, low grade fever, rhinorrhea, headache, sweat/cold spells, decreased appetite and sinus pressure. She notes the smell of mucus and it makes her nauseous and vomiting. Pt states she has been eating about one meal/day for the past 3 days. Pt states her temperature has been fluctuating around 99 degrees. Pt states she attempted to alleviate her symptoms by taking a hot shower, and feels like it broke up minimal amounts of mucus. Pt also tried Robitussin with no relief. She attempted to alleviate her nausea with promethazine. Patient has sick contact. Pt denies abdominal pain, dysuria, frequency or hematuria.  Pt notes she ran out of her BP medication 5 mg enalapril with her last dose being one day ago. Pt is visiting from [place]. Pt denies calling for a refill. She plans to go home on Sunday. She denies missing any other medications. Pt states she is going back November 4 for creatinine test done.   Patient Active Problem List   Diagnosis Date Noted  . Generalized anxiety disorder 05/08/2011  . 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 Migraine 12/24/2010  . 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 Lupus  nephritis 06/2010    Followed by Dr. Gavin Potters and Dr. Cherylann Ratel  . Lupoid nephritis   . Lupoid nephritis   . Lupoid nephritis   . Lupoid nephritis   . Rheumatoid arthritis(714.0)    Past Surgical History  Procedure Laterality Date  . Renal biopsy  06/2010   No Known Allergies Prior to Admission medications   Medication Sig Start Date End Date Taking? Authorizing Provider  Calcium Carbonate-Vit D-Min (CALCIUM 1200 PO) Take 1,200 mg by mouth 2 (two) times daily.   Yes Historical Provider, MD  enalapril (VASOTEC) 5 MG tablet Take 5 mg by mouth daily.    Yes Historical Provider, MD  famotidine (PEPCID) 20 MG tablet Take 20 mg by mouth 2 (two) times daily.   Yes Historical Provider, MD  folic acid (FOLVITE) 1 MG tablet Take 1 mg by mouth daily.   Yes Historical Provider, MD  HYDROcodone-acetaminophen (NORCO) 10-325 MG per tablet Take 1 tablet by mouth every 6 (six) hours as needed for moderate pain.   Yes Historical Provider, MD  hydroxychloroquine (PLAQUENIL) 200 MG tablet Take 2 mg by mouth 2 (two) times daily.    Yes Historical Provider, MD  ibuprofen (ADVIL,MOTRIN) 200 MG tablet Take 400-800 mg by mouth every 6 (six) hours as needed for mild pain.   Yes Historical Provider, MD  methotrexate (RHEUMATREX) 2.5 MG tablet Take 15 mg by mouth once a week. Caution:Chemotherapy. Protect from light.   Yes Historical Provider, MD  mycophenolate (CELLCEPT) 500  MG tablet Take 1,500 mg by mouth 2 (two) times daily.    Yes Historical Provider, MD  oxyCODONE-acetaminophen (PERCOCET/ROXICET) 5-325 MG per tablet Take 1 tablet by mouth every 4 (four) hours as needed for severe pain.   Yes Historical Provider, MD  propranolol (INDERAL LA) 60 MG 24 hr capsule Take 1 capsule (60 mg total) by mouth daily. 01/09/11 01/04/12  Shelia Media, MD   History   Social History  . Marital Status: Single    Spouse Name: N/A    Number of Children: N/A  . Years of Education: High Schoo   Occupational History  . Retail     Social History Main Topics  . Smoking status: Former Smoker -- 0.25 packs/day    Types: Cigarettes  . Smokeless tobacco: Not on file  . Alcohol Use: Yes  . Drug Use: No  . Sexual Activity: Not on file   Other Topics Concern  . Not on file   Social History Narrative   Lives with boyfriend.   Review of Systems  Constitutional: Positive for fever, chills and appetite change. Negative for fatigue.  HENT: Positive for congestion, postnasal drip, rhinorrhea and sinus pressure.   Respiratory: Negative for chest tightness and shortness of breath.   Cardiovascular: Negative for chest pain, palpitations and leg swelling.  Gastrointestinal: Positive for nausea and vomiting. Negative for abdominal pain and blood in stool.  Genitourinary: Negative for dysuria, urgency, frequency and hematuria.  Neurological: Positive for headaches. Negative for dizziness, syncope and light-headedness.  All other systems reviewed and are negative.   Objective:   Filed Vitals:   03/02/14 1924  BP: 146/88  Pulse: 96  Temp: 99 F (37.2 C)  TempSrc: Oral  Resp: 16  Height: 5' 2.5" (1.588 m)  Weight: 156 lb 9.6 oz (71.033 kg)  SpO2: 98%    Physical Exam  Vitals reviewed. Constitutional: She is oriented to person, place, and time. She appears well-developed and well-nourished. No distress.  HENT:  Head: Normocephalic and atraumatic.  Right Ear: Hearing, tympanic membrane, external ear and ear canal normal.  Left Ear: Hearing, tympanic membrane, external ear and ear canal normal.  Nose: Nose normal. Right sinus exhibits no maxillary sinus tenderness and no frontal sinus tenderness. Left sinus exhibits no maxillary sinus tenderness and no frontal sinus tenderness.  Mouth/Throat: Oropharynx is clear and moist. No oropharyngeal exudate.  Eyes: Conjunctivae and EOM are normal. Pupils are equal, round, and reactive to light.  Neck: Carotid bruit is not present.  Cardiovascular: Normal rate, regular  rhythm, normal heart sounds and intact distal pulses.   No murmur heard. Pulmonary/Chest: Effort normal and breath sounds normal. No respiratory distress. She has no wheezes. She has no rhonchi.  Abdominal: Soft. She exhibits no distension and no pulsatile midline mass. There is no tenderness.  Neurological: She is alert and oriented to person, place, and time.  Skin: Skin is warm and dry. No rash noted.  Psychiatric: She has a normal mood and affect. Her behavior is normal.   Results for orders placed in visit on 03/02/14  POCT CBC      Result Value Ref Range   WBC 7.4  4.6 - 10.2 K/uL   Lymph, poc 1.3  0.6 - 3.4   POC LYMPH PERCENT 17.7  10 - 50 %L   MID (cbc) 0.6  0 - 0.9   POC MID % 8.5  0 - 12 %M   POC Granulocyte 5.5  2 - 6.9   Granulocyte  percent 73.8  37 - 80 %G   RBC 4.86  4.04 - 5.48 M/uL   Hemoglobin 13.6  12.2 - 16.2 g/dL   HCT, POC 85.4  62.7 - 47.9 %   MCV 86.7  80 - 97 fL   MCH, POC 28.0  27 - 31.2 pg   MCHC 32.3  31.8 - 35.4 g/dL   RDW, POC 03.5     Platelet Count, POC 239  142 - 424 K/uL   MPV 8.5  0 - 99.8 fL    Assessment & Plan:  8:14 PM- Patient informed of current plan for treatment and evaluation and agrees with plan at this time. Candice [name] is a 23 y.o. female Cough - Plan: POCT CBC, Acute upper respiratory infection - Plan: POCT CBC, azithromycin (ZITHROMAX) 250 MG tablet, DISCONTINUED: azithromycin (ZITHROMAX) 250 MG tablet  Suspected viral illnes with sick contact, sx care discussed, but can fill zpak if not improving into next weeks as some underlying immune compromise with lupus medications. rtc precautions given.   Nausea and vomiting, vomiting of unspecified type - Plan: POCT CBC, Basic metabolic panel, ondansetron (ZOFRAN ODT) 4 MG disintegrating tablet - may be form PND/ mucus and post tussive cough. Abdomen exam reassuring in office. Will check BMP with underlying Lupus nephritis. Rtc/er precautions.   Lupus nephritis, Secondary hypertension,  unspecified - Plan: Basic metabolic panel, enalapril (VASOTEC) 5 MG tablet - refilled [facility]- I until follow up with nephrologist - advised to call them for further refills.   Acute sinusitis, recurrence not specified, unspecified location - Plan: azithromycin (ZITHROMAX) 250 MG tablet, DISCONTINUED: azithromycin (ZITHROMAX) 250 MG tablet  As above - z pak if not improving for possible sinus infection.    Meds ordered this encounter  Medications  . ondansetron (ZOFRAN ODT) 4 MG disintegrating tablet    Sig: Take 1 tablet (4 mg total) by mouth every 8 (eight) hours as needed for nausea or vomiting.    Dispense:  10 tablet    Refill:  0  . DISCONTD: azithromycin (ZITHROMAX) 250 MG tablet    Sig: Take 2 pills by mouth on day 1, then 1 pill by mouth per day on days 2 through 5.    Dispense:  6 tablet    Refill:  0  . azithromycin (ZITHROMAX) 250 MG tablet    Sig: Take 2 pills by mouth on day 1, then 1 pill by mouth per day on days 2 through 5.    Dispense:  6 tablet    Refill:  0  . enalapril (VASOTEC) 5 MG tablet    Sig: Take 1 tablet (5 mg total) by mouth daily.    Dispense:  30 tablet    Refill:  0   Patient Instructions  You should receive a call or letter about your lab results within the next week to 10 days, but if you have not heard about your kidney test prior to appointment with your specialist, call us about the results.  Saline nasal spray atleast 4 times per day, over the counter mucinex or mucinex DM, drink plenty of fluids.  If sinus symptoms not improving into next week - can fill antibiotic - Zpak.   I wrote you for some enalapril since you are out, but follow up for other refills with your primary provider.   Zofran if needed for nausea, but if this worsens - recheck here or your primary care provider.   Return to the clinic or go to the  nearest emergency room if any of your symptoms worsen or new symptoms occur.   Upper Respiratory Infection, Adult An upper  respiratory infection (URI) is also sometimes known as the common cold. The upper respiratory tract includes the nose, sinuses, throat, trachea, and bronchi. Bronchi are the airways leading to the lungs. Most people improve within 1 week, but symptoms can last up to 2 weeks. A residual cough may last even longer.  CAUSES Many different viruses can infect the tissues lining the upper respiratory tract. The tissues become irritated and inflamed and often become very moist. Mucus production is also common. A cold is contagious. You can easily spread the virus to others by oral contact. This includes kissing, sharing a glass, coughing, or sneezing. Touching your mouth or nose and then touching a surface, which is then touched by another person, can also spread the virus. SYMPTOMS  Symptoms typically develop 1 to 3 days after you come in contact with a cold virus. Symptoms vary from person to person. They may include:  Runny nose.  Sneezing.  Nasal congestion.  Sinus irritation.  Sore throat.  Loss of voice (laryngitis).  Cough.  Fatigue.  Muscle aches.  Loss of appetite.  Headache.  Low-grade fever. DIAGNOSIS  You might diagnose your own cold based on familiar symptoms, since most people get a cold 2 to 3 times a year. Your caregiver can confirm this based on your exam. Most importantly, your caregiver can check that your symptoms are not due to another disease such as strep throat, sinusitis, pneumonia, asthma, or epiglottitis. Blood tests, throat tests, and X-rays are not necessary to diagnose a common cold, but they may sometimes be helpful in excluding other more serious diseases. Your caregiver will decide if any further tests are required. RISKS AND COMPLICATIONS  You may be at risk for a more severe case of the common cold if you smoke cigarettes, have chronic heart disease (such as heart failure) or lung disease (such as asthma), or if you have a weakened immune system. The very  young and very old are also at risk for more serious infections. Bacterial sinusitis, middle ear infections, and bacterial pneumonia can complicate the common cold. The common cold can worsen asthma and chronic obstructive pulmonary disease (COPD). Sometimes, these complications can require emergency medical care and may be life-threatening. PREVENTION  The best way to protect against getting a cold is to practice good hygiene. Avoid oral or hand contact with people with cold symptoms. Wash your hands often if contact occurs. There is no clear evidence that vitamin C, vitamin E, echinacea, or exercise reduces the chance of developing a cold. However, it is always recommended to get plenty of rest and practice good nutrition. TREATMENT  Treatment is directed at relieving symptoms. There is no cure. Antibiotics are not effective, because the infection is caused by a virus, not by bacteria. Treatment may include:  Increased fluid intake. Sports drinks offer valuable electrolytes, sugars, and fluids.  Breathing heated mist or steam (vaporizer or shower).  Eating chicken soup or other clear broths, and maintaining good nutrition.  Getting plenty of rest.  Using gargles or lozenges for comfort.  Controlling fevers with ibuprofen or acetaminophen as directed by your caregiver.  Increasing usage of your inhaler if you have asthma. Zinc gel and zinc lozenges, taken in the first 24 hours of the common cold, can shorten the duration and lessen the severity of symptoms. Pain medicines may help with fever, muscle aches, and throat  pain. A variety of non-prescription medicines are available to treat congestion and runny nose. Your caregiver can make recommendations and may suggest nasal or lung inhalers for other symptoms.  HOME CARE INSTRUCTIONS   Only take over-the-counter or prescription medicines for pain, discomfort, or fever as directed by your caregiver.  Use a warm mist humidifier or inhale steam  from a shower to increase air moisture. This may keep secretions moist and make it easier to breathe.  Drink enough water and fluids to keep your urine clear or pale yellow.  Rest as needed.  Return to work when your temperature has returned to normal or as your caregiver advises. You may need to stay home longer to avoid infecting others. You can also use a face mask and careful hand washing to prevent spread of the virus. SEEK MEDICAL CARE IF:   After the first few days, you feel you are getting worse rather than better.  You need your caregiver's advice about medicines to control symptoms.  You develop chills, worsening shortness of breath, or brown or red sputum. These may be signs of pneumonia.  You develop yellow or brown nasal discharge or pain in the face, especially when you bend forward. These may be signs of sinusitis.  You develop a fever, swollen neck glands, pain with swallowing, or white areas in the back of your throat. These may be signs of strep throat. SEEK IMMEDIATE MEDICAL CARE IF:   You have a fever.  You develop severe or persistent headache, ear pain, sinus pain, or chest pain.  You develop wheezing, a prolonged cough, cough up blood, or have a change in your usual mucus (if you have chronic lung disease).  You develop sore muscles or a stiff neck. Document Released: 10/16/2000 Document Revised: 07/15/2011 Document Reviewed: 07/28/2013 [facility] Patient Information 2015 [place], [place]. This information is not intended to replace advice given to you by your health care provider. Make sure you discuss any questions you have with your health care provider.    I personally performed the services described in this documentation, which was scribed in my presence. The recorded information has been reviewed and considered, and addended by me as needed.

## 2014-03-02 NOTE — Patient Instructions (Addendum)
You should receive a call or letter about your lab results within the next week to 10 days, but if you have not heard about your kidney test prior to appointment with your specialist, call us about the results.  Saline nasal spray atleast 4 times per day, over the counter mucinex or mucinex DM, drink plenty of fluids.  If sinus symptoms not improving into next week - can fill antibiotic - Zpak.   I wrote you for some enalapril since you are out, but follow up for other refills with your primary provider.   Zofran if needed for nausea, but if this worsens - recheck here or your primary care provider.   Return to the clinic or go to the nearest emergency room if any of your symptoms worsen or new symptoms occur.   Upper Respiratory Infection, Adult An upper respiratory infection (URI) is also sometimes known as the common cold. The upper respiratory tract includes the nose, sinuses, throat, trachea, and bronchi. Bronchi are the airways leading to the lungs. Most people improve within 1 week, but symptoms can last up to 2 weeks. A residual cough may last even longer.  CAUSES Many different viruses can infect the tissues lining the upper respiratory tract. The tissues become irritated and inflamed and often become very moist. Mucus production is also common. A cold is contagious. You can easily spread the virus to others by oral contact. This includes kissing, sharing a glass, coughing, or sneezing. Touching your mouth or nose and then touching a surface, which is then touched by another person, can also spread the virus. SYMPTOMS  Symptoms typically develop 1 to 3 days after you come in contact with a cold virus. Symptoms vary from person to person. They may include:  Runny nose.  Sneezing.  Nasal congestion.  Sinus irritation.  Sore throat.  Loss of voice (laryngitis).  Cough.  Fatigue.  Muscle aches.  Loss of appetite.  Headache.  Low-grade fever. DIAGNOSIS  You might diagnose  your own cold based on familiar symptoms, since most people get a cold 2 to 3 times a year. Your caregiver can confirm this based on your exam. Most importantly, your caregiver can check that your symptoms are not due to another disease such as strep throat, sinusitis, pneumonia, asthma, or epiglottitis. Blood tests, throat tests, and X-rays are not necessary to diagnose a common cold, but they may sometimes be helpful in excluding other more serious diseases. Your caregiver will decide if any further tests are required. RISKS AND COMPLICATIONS  You may be at risk for a more severe case of the common cold if you smoke cigarettes, have chronic heart disease (such as heart failure) or lung disease (such as asthma), or if you have a weakened immune system. The very young and very old are also at risk for more serious infections. Bacterial sinusitis, middle ear infections, and bacterial pneumonia can complicate the common cold. The common cold can worsen asthma and chronic obstructive pulmonary disease (COPD). Sometimes, these complications can require emergency medical care and may be life-threatening. PREVENTION  The best way to protect against getting a cold is to practice good hygiene. Avoid oral or hand contact with people with cold symptoms. Wash your hands often if contact occurs. There is no clear evidence that vitamin C, vitamin E, echinacea, or exercise reduces the chance of developing a cold. However, it is always recommended to get plenty of rest and practice good nutrition. TREATMENT  Treatment is directed at relieving symptoms.  There is no cure. Antibiotics are not effective, because the infection is caused by a virus, not by bacteria. Treatment may include:  Increased fluid intake. Sports drinks offer valuable electrolytes, sugars, and fluids.  Breathing heated mist or steam (vaporizer or shower).  Eating chicken soup or other clear broths, and maintaining good nutrition.  Getting plenty of  rest.  Using gargles or lozenges for comfort.  Controlling fevers with ibuprofen or acetaminophen as directed by your caregiver.  Increasing usage of your inhaler if you have asthma. Zinc gel and zinc lozenges, taken in the first 24 hours of the common cold, can shorten the duration and lessen the severity of symptoms. Pain medicines may help with fever, muscle aches, and throat pain. A variety of non-prescription medicines are available to treat congestion and runny nose. Your caregiver can make recommendations and may suggest nasal or lung inhalers for other symptoms.  HOME CARE INSTRUCTIONS   Only take over-the-counter or prescription medicines for pain, discomfort, or fever as directed by your caregiver.  Use a warm mist humidifier or inhale steam from a shower to increase air moisture. This may keep secretions moist and make it easier to breathe.  Drink enough water and fluids to keep your urine clear or pale yellow.  Rest as needed.  Return to work when your temperature has returned to normal or as your caregiver advises. You may need to stay home longer to avoid infecting others. You can also use a face mask and careful hand washing to prevent spread of the virus. SEEK MEDICAL CARE IF:   After the first few days, you feel you are getting worse rather than better.  You need your caregiver's advice about medicines to control symptoms.  You develop chills, worsening shortness of breath, or brown or red sputum. These may be signs of pneumonia.  You develop yellow or brown nasal discharge or pain in the face, especially when you bend forward. These may be signs of sinusitis.  You develop a fever, swollen neck glands, pain with swallowing, or white areas in the back of your throat. These may be signs of strep throat. SEEK IMMEDIATE MEDICAL CARE IF:   You have a fever.  You develop severe or persistent headache, ear pain, sinus pain, or chest pain.  You develop wheezing, a  prolonged cough, cough up blood, or have a change in your usual mucus (if you have chronic lung disease).  You develop sore muscles or a stiff neck. Document Released: 10/16/2000 Document Revised: 07/15/2011 Document Reviewed: 07/28/2013 [facility] Patient Information 2015 [place], [place]. This information is not intended to replace advice given to you by your health care provider. Make sure you discuss any questions you have with your health care provider.

## 2014-03-03 LAB — BASIC METABOLIC PANEL
BUN: 12 mg/dL (ref 6–23)
CALCIUM: 9 mg/dL (ref 8.4–10.5)
CO2: 27 mEq/L (ref 19–32)
CREATININE: 0.78 mg/dL (ref 0.50–1.10)
Chloride: 106 mEq/L (ref 96–112)
GLUCOSE: 92 mg/dL (ref 70–99)
Potassium: 4.4 mEq/L (ref 3.5–5.3)
Sodium: 141 mEq/L (ref 135–145)

## 2014-04-27 ENCOUNTER — Encounter (HOSPITAL_COMMUNITY): Payer: Self-pay | Admitting: Emergency Medicine

## 2014-04-27 ENCOUNTER — Emergency Department (HOSPITAL_COMMUNITY): Payer: 59

## 2014-04-27 ENCOUNTER — Emergency Department (HOSPITAL_COMMUNITY)
Admission: EM | Admit: 2014-04-27 | Discharge: 2014-04-27 | Disposition: A | Discharge status: Home / Self Care | Payer: 59 | Attending: Emergency Medicine | Admitting: Emergency Medicine

## 2014-04-27 DIAGNOSIS — Z79899 Other long term (current) drug therapy: Secondary | ICD-10-CM | POA: Diagnosis not present

## 2014-04-27 DIAGNOSIS — M069 Rheumatoid arthritis, unspecified: Secondary | ICD-10-CM | POA: Insufficient documentation

## 2014-04-27 DIAGNOSIS — Z87891 Personal history of nicotine dependence: Secondary | ICD-10-CM | POA: Insufficient documentation

## 2014-04-27 DIAGNOSIS — Z792 Long term (current) use of antibiotics: Secondary | ICD-10-CM | POA: Diagnosis not present

## 2014-04-27 DIAGNOSIS — R091 Pleurisy: Secondary | ICD-10-CM

## 2014-04-27 DIAGNOSIS — Z79891 Long term (current) use of opiate analgesic: Secondary | ICD-10-CM | POA: Insufficient documentation

## 2014-04-27 DIAGNOSIS — R079 Chest pain, unspecified: Secondary | ICD-10-CM | POA: Diagnosis present

## 2014-04-27 HISTORY — DX: Systemic lupus erythematosus, unspecified: M32.9

## 2014-04-27 HISTORY — DX: Reserved for concepts with insufficient information to code with codable children: IMO0002

## 2014-04-27 LAB — CBC WITH DIFFERENTIAL/PLATELET
BASOS ABS: 0 10*3/uL (ref 0.0–0.1)
BASOS PCT: 0 % (ref 0–1)
EOS PCT: 1 % (ref 0–5)
Eosinophils Absolute: 0.1 10*3/uL (ref 0.0–0.7)
HCT: 40.6 % (ref 36.0–46.0)
Hemoglobin: 13.8 g/dL (ref 12.0–15.0)
LYMPHS PCT: 10 % — AB (ref 12–46)
Lymphs Abs: 1.3 10*3/uL (ref 0.7–4.0)
MCH: 28.2 pg (ref 26.0–34.0)
MCHC: 34 g/dL (ref 30.0–36.0)
MCV: 83 fL (ref 78.0–100.0)
Monocytes Absolute: 0.6 10*3/uL (ref 0.1–1.0)
Monocytes Relative: 4 % (ref 3–12)
NEUTROS ABS: 10.7 10*3/uL — AB (ref 1.7–7.7)
Neutrophils Relative %: 85 % — ABNORMAL HIGH (ref 43–77)
PLATELETS: 231 10*3/uL (ref 150–400)
RBC: 4.89 MIL/uL (ref 3.87–5.11)
RDW: 13.5 % (ref 11.5–15.5)
WBC: 12.6 10*3/uL — AB (ref 4.0–10.5)

## 2014-04-27 LAB — I-STAT TROPONIN, ED: TROPONIN I, POC: 0 ng/mL (ref 0.00–0.08)

## 2014-04-27 LAB — BASIC METABOLIC PANEL
ANION GAP: 10 (ref 5–15)
BUN: 13 mg/dL (ref 6–23)
CALCIUM: 8.6 mg/dL (ref 8.4–10.5)
CO2: 21 mmol/L (ref 19–32)
Chloride: 102 mEq/L (ref 96–112)
Creatinine, Ser: 0.84 mg/dL (ref 0.50–1.10)
GFR calc Af Amer: >90 mL/min (ref >90)
Glucose, Bld: 118 mg/dL — ABNORMAL HIGH (ref 70–99)
Potassium: 3.5 mmol/L (ref 3.5–5.1)
SODIUM: 133 mmol/L — AB (ref 135–145)

## 2014-04-27 MED ORDER — KETOROLAC TROMETHAMINE 30 MG/ML IJ SOLN
30.0000 mg | Freq: Once | INTRAMUSCULAR | Status: AC
  Administered 2014-04-27: 30 mg via INTRAVENOUS
  Filled 2014-04-27: qty 1

## 2014-04-27 MED ORDER — METHYLPREDNISOLONE SODIUM SUCC 125 MG IJ SOLR
80.0000 mg | Freq: Once | INTRAMUSCULAR | Status: AC
  Administered 2014-04-27: 80 mg via INTRAVENOUS
  Filled 2014-04-27: qty 2

## 2014-04-27 MED ORDER — HYDROCODONE-ACETAMINOPHEN 5-325 MG PO TABS: 1.0000 | ORAL_TABLET | Freq: Four times a day (QID) | ORAL | Status: DC | PRN

## 2014-04-27 MED ORDER — PREDNISONE 10 MG PO TABS: 20.0000 mg | ORAL_TABLET | Freq: Two times a day (BID) | ORAL | Status: DC

## 2014-04-27 MED ORDER — MORPHINE SULFATE 4 MG/ML IJ SOLN
4.0000 mg | Freq: Once | INTRAMUSCULAR | Status: AC
  Administered 2014-04-27: 4 mg via INTRAVENOUS
  Filled 2014-04-27: qty 1

## 2014-04-27 NOTE — ED Notes (Addendum)
Dr. Judd Lien at bedside for assessment.

## 2014-04-27 NOTE — ED Notes (Signed)
Patient verbalized understanding of discharge instructions and the need for follow up care.  Patient taken to the waiting room in a wheelchair.

## 2014-04-27 NOTE — ED Provider Notes (Signed)
CSN: [id number]     Arrival date & time 04/27/14  0704 History   First MD Initiated Contact with Patient 04/27/14 [phone]     Chief Complaint  Patient presents with  . Chest Pain     (Consider location/radiation/quality/duration/timing/severity/associated sxs/prior Treatment) HPI Comments: Patient is a 23 year old female with history of lupus and arthritis. She presents today with complaints of chest pain that is sharp in nature and woke her from sleep approximately 5 hours ago. Her pain is worse with breathing but she denies shortness of breath. She denies fevers, chills, or productive cough. She has had episodes of this in the past and has been told she has had pleurisy. She feels as though this is occurring again as her pain is exactly like what was at that time.  Patient is a 23 y.o. female presenting with chest pain. The history is provided by the patient.  Chest Pain Pain location:  Substernal area Pain quality: sharp   Pain radiates to:  Does not radiate Pain radiates to the back: no   Pain severity:  Moderate Onset quality:  Sudden Duration:  6 hours Timing:  Constant Progression:  Worsening Chronicity:  Recurrent Context: breathing and movement   Relieved by:  Nothing Worsened by:  Deep breathing Ineffective treatments:  None tried Associated symptoms: no fatigue, no fever and no palpitations     Past Medical History  Diagnosis Date  . 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 Gavin Potters and Dr. Cherylann Ratel  . Lupoid nephritis   . Lupoid nephritis   . Lupoid nephritis   . Lupoid nephritis   . Rheumatoid arthritis(714.0)    Past Surgical History  Procedure Laterality Date  . Renal biopsy  06/2010   Family History  Problem Relation Age of Onset  . Hypertension Father   . Scleroderma Maternal Grandmother    History  Substance Use Topics  . Smoking status: Former Smoker -- 0.25 packs/day    Types: Cigarettes  . Smokeless tobacco: Not on file  . Alcohol Use: Yes    OB History    Gravida Para Term Preterm AB TAB SAB Ectopic Multiple Living   0 0 0 0 0 0 0 0 0 0      Review of Systems  Constitutional: Negative for fever and fatigue.  Cardiovascular: Positive for chest pain. Negative for palpitations.  All other systems reviewed and are negative.     Allergies  Review of patient's allergies indicates no known allergies.  Home Medications   Prior to Admission medications   Medication Sig Start Date End Date Taking? Authorizing Provider  azithromycin (ZITHROMAX) 250 MG tablet Take 2 pills by mouth on day 1, then 1 pill by mouth per day on days 2 through 5. 03/02/14   03/04/14, MD  Calcium Carbonate-Vit D-Min (CALCIUM 1200 PO) Take 1,200 mg by mouth 2 (two) times daily.    Historical Provider, MD  enalapril (VASOTEC) 5 MG tablet Take 1 tablet (5 mg total) by mouth daily. 03/02/14   03/04/14, MD  famotidine (PEPCID) 20 MG tablet Take 20 mg by mouth 2 (two) times daily.    Historical Provider, MD  folic acid (FOLVITE) 1 MG tablet Take 1 mg by mouth daily.    Historical Provider, MD  HYDROcodone-acetaminophen (NORCO) 10-325 MG per tablet Take 1 tablet by mouth every 6 (six) hours as needed for moderate pain.    Historical Provider, MD  hydroxychloroquine (PLAQUENIL) 200 MG tablet Take 2 mg by  mouth 2 (two) times daily.     Historical Provider, MD  ibuprofen (ADVIL,MOTRIN) 200 MG tablet Take 400-800 mg by mouth every 6 (six) hours as needed for mild pain.    Historical Provider, MD  methotrexate (RHEUMATREX) 2.5 MG tablet Take 15 mg by mouth once a week. Caution:Chemotherapy. Protect from light.    Historical Provider, MD  mycophenolate (CELLCEPT) 500 MG tablet Take 1,500 mg by mouth 2 (two) times daily.     Historical Provider, MD  ondansetron (ZOFRAN ODT) 4 MG disintegrating tablet Take 1 tablet (4 mg total) by mouth every 8 (eight) hours as needed for nausea or vomiting. 03/02/14   Shade Flood, MD  oxyCODONE-acetaminophen  (PERCOCET/ROXICET) 5-325 MG per tablet Take 1 tablet by mouth every 4 (four) hours as needed for severe pain.    Historical Provider, MD  propranolol (INDERAL LA) 60 MG 24 hr capsule Take 1 capsule (60 mg total) by mouth daily. 01/09/11 01/04/12  Shelia Media, MD   BP 114/76 mmHg  Pulse 98  Temp(Src) 97.9 F (36.6 C) (Oral)  Resp 23  Ht 5\' 3"  (1.6 m)  Wt 154 lb (69.854 kg)  BMI 27.29 kg/m2  SpO2 99%  LMP 04/21/2014 Physical Exam  Constitutional: She is oriented to person, place, and time. She appears well-developed and well-nourished. No distress.  HENT:  Head: Normocephalic and atraumatic.  Neck: Normal range of motion. Neck supple.  Cardiovascular: Normal rate and regular rhythm.  Exam reveals no gallop and no friction rub.   No murmur heard. Pulmonary/Chest: Effort normal and breath sounds normal. No respiratory distress. She has no wheezes. She exhibits tenderness.  There is tenderness to palpation of the anterior chest wall.  Abdominal: Soft. Bowel sounds are normal. She exhibits no distension. There is no tenderness.  Musculoskeletal: Normal range of motion.  There is no ankle edema. There is no calf swelling or tenderness and Homans sign is absent bilaterally.  Neurological: She is alert and oriented to person, place, and time.  Skin: Skin is warm and dry. She is not diaphoretic.  Nursing note and vitals reviewed.   ED Course  Procedures (including critical care time) Labs Review Labs Reviewed  BASIC METABOLIC PANEL  CBC WITH DIFFERENTIAL  I-STAT TROPOININ, ED    Imaging Review No results found.   EKG Interpretation   Date/Time:  Wednesday April 27 2014 07:09:37 EST Ventricular Rate:  102 PR Interval:  148 QRS Duration: 76 QT Interval:  324 QTC Calculation: 422 R Axis:   62 Text Interpretation:  Sinus tachycardia Nonspecific T wave abnormality  Abnormal ECG Confirmed by DELOS  MD, [facility] (03[birth year]-1995) on 04/27/2014 7:48:37  AM      MDM   Final  diagnoses:  None    Patient presents with complaints of pleuritic chest discomfort. She was diagnosed with pleurisy on 2 separate occasions. She has a history of arthritis. This may well be worse the, but could also be a costochondritis. She was given pain medication and IV steroids in the ER and is feeling better. Her oxygen saturations are 100%, there is no tachypnea, and no tachycardia and I doubt a PE. She has had negative d-dimer is in the past with similar symptoms. I do not feel as though workup and a PE is indicated at this time. She will be treated with steroids, pain medication, and when necessary return.    04/29/2014, MD 04/27/14 [phone]

## 2014-04-27 NOTE — ED Notes (Signed)
MD at bedside. 

## 2014-04-27 NOTE — Discharge Instructions (Signed)
Prednisone as prescribed.  Hydrocodone as prescribed as needed for pain.  Return to the emergency department if you develop difficulty breathing, worsening pain, or any other new and concerning symptoms.   Pleurisy Pleurisy is an inflammation and swelling of the lining of the lungs (pleura). Because of this inflammation, it hurts to breathe. It can be aggravated by coughing, laughing, or deep breathing. Pleurisy is often caused by an underlying infection or disease.  HOME CARE INSTRUCTIONS  Monitor your pleurisy for any changes. The following actions may help to alleviate any discomfort you are experiencing:  Medicine may help with pain. Only take over-the-counter or prescription medicines for pain, discomfort, or fever as directed by your health care provider.  Only take antibiotic medicine as directed. Make sure to finish it even if you start to feel better. SEEK MEDICAL CARE IF:   Your pain is not controlled with medicine or is increasing.  You have an increase in pus-like (purulent) secretions brought up with coughing. SEEK IMMEDIATE MEDICAL CARE IF:   You have blue or dark lips, fingernails, or toenails.  You are coughing up blood.  You have increased difficulty breathing.  You have continuing pain unrelieved by medicine or pain lasting more than 1 week.  You have pain that radiates into your neck, arms, or jaw.  You develop increased shortness of breath or wheezing.  You develop a fever, rash, vomiting, fainting, or other serious symptoms. MAKE SURE YOU:  Understand these instructions.   Will watch your condition.   Will get help right away if you are not doing well or get worse.  Document Released: 04/22/2005 Document Revised: 12/23/2012 Document Reviewed: 10/04/2012 [facility] Patient Information 2015 [name], [place]. This information is not intended to replace advice given to you by your health care provider. Make sure you discuss any questions you have with your  health care provider.

## 2014-04-27 NOTE — ED Notes (Signed)
Pt. woke up this morning with central chest pain worse with deep inspiration and mild SOB . Denies nausea or diaphoresis .

## 2014-06-29 ENCOUNTER — Ambulatory Visit (INDEPENDENT_AMBULATORY_CARE_PROVIDER_SITE_OTHER): Payer: 59 | Admitting: Family Medicine

## 2014-06-29 ENCOUNTER — Ambulatory Visit (INDEPENDENT_AMBULATORY_CARE_PROVIDER_SITE_OTHER): Payer: 59

## 2014-06-29 VITALS — BP 124/80 | HR 91 | Temp 98.0°F | Resp 16 | Ht 62.5 in | Wt 153.2 lb

## 2014-06-29 DIAGNOSIS — IMO0002 Reserved for concepts with insufficient information to code with codable children: Secondary | ICD-10-CM

## 2014-06-29 DIAGNOSIS — M8700 Idiopathic aseptic necrosis of unspecified bone: Secondary | ICD-10-CM

## 2014-06-29 DIAGNOSIS — R0781 Pleurodynia: Secondary | ICD-10-CM

## 2014-06-29 DIAGNOSIS — R079 Chest pain, unspecified: Secondary | ICD-10-CM

## 2014-06-29 DIAGNOSIS — M879 Osteonecrosis, unspecified: Secondary | ICD-10-CM

## 2014-06-29 DIAGNOSIS — M329 Systemic lupus erythematosus, unspecified: Secondary | ICD-10-CM

## 2014-06-29 MED ORDER — PREDNISONE 20 MG PO TABS: 20.0000 mg | ORAL_TABLET | Freq: Two times a day (BID) | ORAL | Status: AC

## 2014-06-29 NOTE — Progress Notes (Signed)
Chief Complaint:  Chief Complaint  Patient presents with  . Chest Pain    x today  . Medication Refill    need prednisone    HPI: Candice [name] is a 24 y.o. female who is here for acute onset of CP which started today , she has CP with cough, deep breathing.  She has Lupus and when she gets like this her endocrinologist gives her prednisone. She is visiting from [facility] and is supposed to drive home tomorrow. She has been seen in the ED for this and xrays were done on her last visit and she was given prednisone and felt better. She did not bring her prednisone on this visit and is here to see if she can get some prednisone sine usually helps with pain after 1st or 2nd dose. Sharp pain, constant, worse with deep breath, needs to stand to make her feel better. Has not really tried anything for it except regular meds.   Denies any fevers or chills, CO, HAs.   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 She has issues with her hips as well.   Past Medical History  Diagnosis Date  . 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 Gavin Potters and Dr. Cherylann Ratel  . Lupoid nephritis   . Lupoid nephritis   . Lupoid nephritis   . Lupoid nephritis   . Rheumatoid arthritis(714.0)   . Lupus    Past Surgical History  Procedure Laterality Date  . Renal biopsy  06/2010   History   Social History  . Marital Status: Single    Spouse Name: N/A  . Number of Children: N/A  . Years of Education: [employer] Sch[employer]   Occupational History  . Retail    Social History Main Topics  . Smoking status: Former Smoker -- 0.25 packs/day    Types: Cigarettes  . Smokeless tobacco: Not on file  . Alcohol Use: Yes  . Drug Use: No  . Sexual Activity: Not on file   Other Topics Concern  . None   Social History Narrative   Lives with boyfriend.   Family History  Problem Relation Age of Onset  . Hypertension Father   .  Scleroderma Maternal Grandmother    No Known Allergies Prior to Admission medications   Medication Sig Start Date End Date Taking? Authorizing Provider  Calcium Carbonate-Vit D-Min (CALCIUM 1200 PO) Take 1,200 mg by mouth 2 (two) times daily.   Yes Historical Provider, MD  enalapril (VASOTEC) 5 MG tablet Take 1 tablet (5 mg total) by mouth daily. 03/02/14  Yes Shade Flood, MD  etonogestrel (NEXPLANON) 68 MG IMPL implant 1 each by Subdermal route once.   Yes Historical Provider, MD  famotidine (PEPCID) 20 MG tablet Take 20 mg by mouth 2 (two) times daily.   Yes Historical Provider, MD  hydroxychloroquine (PLAQUENIL) 200 MG tablet Take 200 mg by mouth 2 (two) times daily.    Yes Historical Provider, MD  ibuprofen (ADVIL,MOTRIN) 200 MG tablet Take 400-800 mg by mouth every 6 (six) hours as needed for mild pain.   Yes Historical Provider, MD  mycophenolate (CELLCEPT) 500 MG tablet Take 1,000 mg by mouth 2 (two) times daily.    Yes Historical Provider, MD  oxyCODONE-acetaminophen (PERCOCET) 10-325 MG per tablet Take 1 tablet by mouth every 6 (six) hours as needed for pain.   Yes Historical Provider, MD  phentermine (  ADIPEX-P) 37.5 MG tablet Take 37.5 mg by mouth daily. 04/22/14  Yes Historical Provider, MD  predniSONE (DELTASONE) 10 MG tablet Take 2 tablets (20 mg total) by mouth 2 (two) times daily. 04/27/14  Yes Geoffery Lyons, MD     ROS: The patient denies fevers, chills, night sweats, unintentional weight loss,  palpitations, wheezing, dyspnea on exertion, nausea, vomiting, abdominal pain, dysuria, hematuria, melena, numbness, weakness, or tingling.   All other systems have been reviewed and were otherwise negative with the exception of those mentioned in the HPI and as above.    PHYSICAL EXAM: Filed Vitals:   06/29/14 2004  BP: 124/80  Pulse: 91  Temp: 98 F (36.7 C)  Resp: 16   SpO2 Readings from Last 3 Encounters:  06/29/14 98%  04/27/14 99%  03/02/14 98%    Filed Vitals:     06/29/14 2004  Height: 5' 2.5" (1.588 m)  Weight: 153 lb 3.2 oz (69.491 kg)   Body mass index is 27.56 kg/(m^2).  General: Alert, no acute distress HEENT:  Normocephalic, atraumatic, oropharynx patent. EOMI, PERRLA Cardiovascular:  Regular rate and rhythm, no rubs murmurs or gallops.  No Carotid bruits, radial pulse intact. No pedal edema.  Respiratory: Clear to auscultation bilaterally.  No wheezes, rales, or rhonchi.  No cyanosis, no use of accessory musculature GI: No organomegaly, abdomen is soft and non-tender, positive bowel sounds.  No masses. Skin: No rashes. Neurologic: Facial musculature symmetric. Psychiatric: Patient is appropriate throughout our interaction. Lymphatic: No cervical lymphadenopathy Musculoskeletal: Gait intact.   LABS:    EKG/XRAY:   Primary read interpreted by Dr. Conley Rolls at [facility] Neg for any acute cardiopulm process   ASSESSMENT/PLAN: Encounter Diagnoses  Name Primary?  . Chest pain, unspecified chest pain type Yes  . Pleuritic chest pain   . Lupus   . 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 Pleurisy is usually resolved with low dose prednisone. Chest xray was normal.   Rx prednisone, patient is aware of risk and benefits of long term steroid use. She laso has a hx of nephritis so no NSAIDs for now, will try to avoid narcotics sicne she states that the steroids usually work well for her. She is agreeable to this plan Fu prn     CLINICAL DATA: Chest pain. Pleurisy. Symptoms started today.  EXAM: CHEST 2 VIEW  COMPARISON: 04/27/2014  FINDINGS: The cardiomediastinal contours are normal. The lungs are clear. Pulmonary vasculature is normal. No consolidation, pleural effusion, or pneumothorax. No acute osseous abnormalities are seen.  Interval right shoulder hemiarthroplasty.  IMPRESSION: No acute pulmonary process.   Electronically Signed  By: Rubye Oaks M.D.  On: 06/29/2014 21:41     Gross sideeffects, risk and benefits, and alternatives of medications d/w patient. Patient is aware that all medications have potential sideeffects and we are unable to predict every sideeffect or drug-drug interaction that may occur.  Hamilton Capri PHUONG, DO 07/01/2014 12:32 PM

## 2014-07-01 DIAGNOSIS — M8700 Idiopathic aseptic necrosis of unspecified bone: Secondary | ICD-10-CM | POA: Insufficient documentation

## 2014-08-28 NOTE — H&P (Signed)
PATIENT NAME:  Candice [name], Candice [name] MR#:  [id number] DATE OF BIRTH:  11-11-1990  DATE OF ADMISSION:  06/25/2011  PRIMARY CARE PHYSICIAN: Mady Haagensen, MD   REASON FOR ADMISSION: Accelerated hypertension. The patient also had some visual difficulties and numbness. We were asked to admit her directly to address these issues.   HISTORY OF PRESENT ILLNESS: The patient is a pleasant 24 year old white female with a past medical history significant for lupus nephritis. She has been followed by Dr. Mady Haagensen as an outpatient. Recently her renal function had gotten slightly worse. About two months ago, her prednisone was increased. She is also concurrently taking hydroxychloroquine as well as mycophenolate for her lupus nephritis. The patient also has a history of PRES syndrome and was hospitalized with a seizure in February 2012. The patient reports that earlier today she felt dizzy, and had double vision, and had numbness in her feet. Although symptoms have now resolved, she was seen by Dr. Cherylann Ratel, who gave her a dose of clonidine. Her blood pressure is currently improved. She also reports that over the past few days she has been having nasal congestion, as well as postnasal drip, as well as a cough with some wheezing. She has not had any fevers. She has had nonproductive cough. She otherwise denies any chest pains or palpitations. No syncope. No abdominal pain, nausea, vomiting, or diarrhea.   PAST MEDICAL HISTORY:  1. History of lupus nephritis.  2. 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  3. History of  hypertension.  4. History of anemia.  5. History of migraine headaches which occur around her menstrual period.   PAST SURGICAL HISTORY: She had a renal biopsy, was her only surgery. .   ALLERGIES: None.   CURRENT MEDICATIONS:  1. Alprazolam 0.25 mg, 1 tab p.o. b.i.d. as needed.  2. Enalapril 5 mg daily.   3. Famotidine 20 mg daily.  4. Flexeril daily as needed.  5. Hydroxychloroquine 200 mg daily.  6. Mycophenolate (Mofetil) 500 mg, 3 tabs b.i.d.  7. Prednisone 40 mg daily.  8. Multivitamin 1 tab p.o. daily.   SOCIAL HISTORY: Does not smoke. Does not drink. No drugs.   FAMILY HISTORY: Positive for hypertension.   REVIEW OF SYSTEMS: CONSTITUTIONAL: Denies any fevers or chills. No weight loss. No weight gain. EYES: Had an episode of blurred vision which is currently resolved, no double vision. No erythema. No drainage from her eyes. ENT: No tinnitus. No ear pain. No hearing loss. No difficulty with swallowing. No seasonal or year-round allergies. Does complain of postnasal drip. RESPIRATORY: Complains of dry cough, occasional wheezing with his dry cough. No hemoptysis. No tuberculosis. No pneumonia. CARDIOVASCULAR: No chest pain, orthopnea. No edema. No syncope. GASTROINTESTINAL: No nausea, vomiting, diarrhea. No hematemesis. No hematochezia. No changes in bowel habits. GENITOURINARY: Denies any dysuria, hematuria. ENDOCRINE: No polyuria or nocturia. HEMATOLOGY: No anemia or easy bruisability. SKIN: No acne. No rash. MUSCULOSKELETAL: Has history of pain related to arthritis and related to her arthropathy. NEUROLOGICAL: Complains of numbness in her lower extremities which has since resolved. Has a history of migraine headaches. Has a history of seizure. No seizures since one episode. PSYCHIATRIC: No anxiety, depression.   PHYSICAL EXAMINATION:  VITAL SIGNS: Temperature 98.1, pulse 110, respirations 18, blood pressure 150/93.   GENERAL: The patient is currently not in acute distress.   HEENT: Head  atraumatic, normocephalic. Pupils are equal, round, reactive to light and accommodation. Extraocular movements intact. Oropharynx  is clear without any exudate.   NECK: There is no thyromegaly. No carotid bruits.   CARDIOVASCULAR: Regular rate and rhythm. No murmurs, rubs, clicks, or gallops. PMI is  nondisplaced.   LUNGS: Clear to auscultation bilaterally without any rales, rhonchi, or wheezing.  ABDOMEN: Soft, nontender, nondistended. Positive bowel sounds x4.   EXTREMITIES: No clubbing, cyanosis, edema.   NEUROLOGIC: Awake, alert, oriented x3. Cranial nerves II through XII are grossly intact. No focal deficits.   SKIN: There is no rash.   LYMPHATICS: No lymph nodes palpable.   MUSCULOSKELETAL: There is no erythema or swelling.   PSYCHIATRIC: Currently not anxious or depressed.   LABORATORY, DIAGNOSTIC AND RADIOLOGICAL DATA: Currently pending.   ASSESSMENT AND PLAN: The patient is a 24 year old white female with a history of lupus nephritis, hypertension, who presents with upper respiratory symptoms as well as poorly controlled blood pressure, as well as numbness and visual difficulties.   1. Accelerated hypertension: Possibly related to increased prednisone dose, also could be related to acute illness with upper respiratory infection. At this time, we will go ahead and increase her enalapril to 10 mg. We will add atenolol to her current regimen and will do p.r.n. hydralazine.  2. Upper respiratory syndrome, possibly bacterial: We will place her on p.o. azithromycin, nasal steroids. She has some bronchospasms. We will do Combivent inhalers as needed.  3. Lupus nephritis: Continue hydrochloroquine, prednisone and mycophenolate.  4. 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 Cherylann Ratel. I will follow the results.  5. Miscellaneous: The patient is ambulatory. We will hold off on any anticoagulation.   TIME SPENT:  35 minutes.  ____________________________ Lacie Scotts Allena Katz, MD shp:cbb D: 06/25/2011 16:09:57 ET T: 06/25/2011 19:25:21 ET JOB#: [id number] cc: Lennox Pippins, MD Charise Carwin MD ELECTRONICALLY SIGNED 07/12/2011 7:49

## 2014-08-28 NOTE — Discharge Summary (Signed)
PATIENT NAME:  Candice [name], GRINDLE MR#:  [id number] DATE OF BIRTH:  1990/09/25  DATE OF ADMISSION:  06/25/2011 DATE OF DISCHARGE:  06/26/2011  HISTORY AND PHYSICAL: For a detailed note, please take a look at the History and Physical done by Dr. Auburn Bilberry.   DIAGNOSES AT DISCHARGE:  1. Malignant hypertension. 2. Lupus nephritis.  3. Anxiety.  4. Gastroesophageal reflux disease.   DIET: The patient is being discharged on a low-sodium diet.   ACTIVITY: As tolerated.   FOLLOWUP: Follow up with Dr. Mady Haagensen in the next 1 to 2 weeks.    DISCHARGE MEDICATIONS:   1. Pepcid 20 mg daily.  2. Xanax 0.25 mg b.i.d. as needed. 3. Flexeril as needed.  4. Plaquenil 200 mg daily.  5. Mycophenolate 500 mg, 3 tabs b.i.d.  6. Prednisone 40 mg daily.  7. Enalapril 10 mg daily.  8. Atenolol 50 mg daily.   CONSULTANTS DURING THE HOSPITAL COURSE: Dr. Mosetta Pigeon from Nephrology.   PERTINENT STUDIES DURING HOSPITAL COURSE: MRI of the brain done without contrast showing no acute intracranial findings.   HOSPITAL COURSE: The patient is a 24 year old female with a history of lupus nephritis, hypertension, who presents to the hospital secondary to a blurry vision headache and noted to have malignant hypertension.   1. Malignant hypertension: The patient presented to the hospital with significantly elevated blood pressures. The patient was placed on some p.r.n. IV hydralazine along with her enalapril dose being increased from 5 to 10, and also atenolol added to her regimen. Her blood pressures overnight have significantly improved and presently are stable, and she is presently asymptomatic. She is being discharged on the current regimen as stated, including enalapril and atenolol.  2. History of reversible posterior leukoencephalopathy syndrome: The patient presented to the hospital with some blurry vision and headache. She underwent an MRI of her brain without contrast which showed no acute  abnormalities. It was thought that most of her neurologic symptoms are related to her elevated blood pressures, and they have significantly improved now since the patient's blood pressure is improved, and currently she is asymptomatic.  3. History of lupus nephritis: The patient is currently being maintained on Plaquenil, prednisone and mycophenolate (mofetil). She will resume all those medications as mentioned.  4. Gastroesophageal reflux disease: The patient was maintained on Pepcid, and she will resume that upon discharge.  5. Anxiety: The patient was maintained on Xanax, and she will resume that upon discharge, too.   CODE STATUS:  The patient is a FULL CODE.     TIME SPENT: 35 minutes.   ____________________________ Rolly Pancake. Cherlynn Kaiser, MD vjs:cbb D: 06/26/2011 15:42:38 ET T: 06/26/2011 17:48:59 ET JOB#: [id number]  cc: Rolly Pancake. Cherlynn Kaiser, MD, <Dictator> [place] Lizabeth Leyden, MD Houston Siren MD ELECTRONICALLY SIGNED 06/27/2011 9:04

## 2015-11-04 IMAGING — CR DG CHEST 2V
2 series · 2 of 2 positions shown · non-contrast
Comparison: 04/27/2014

CLINICAL DATA: Chest pain.  Pleurisy.  Symptoms started today.

EXAM:
CHEST  2 VIEW

[PA]
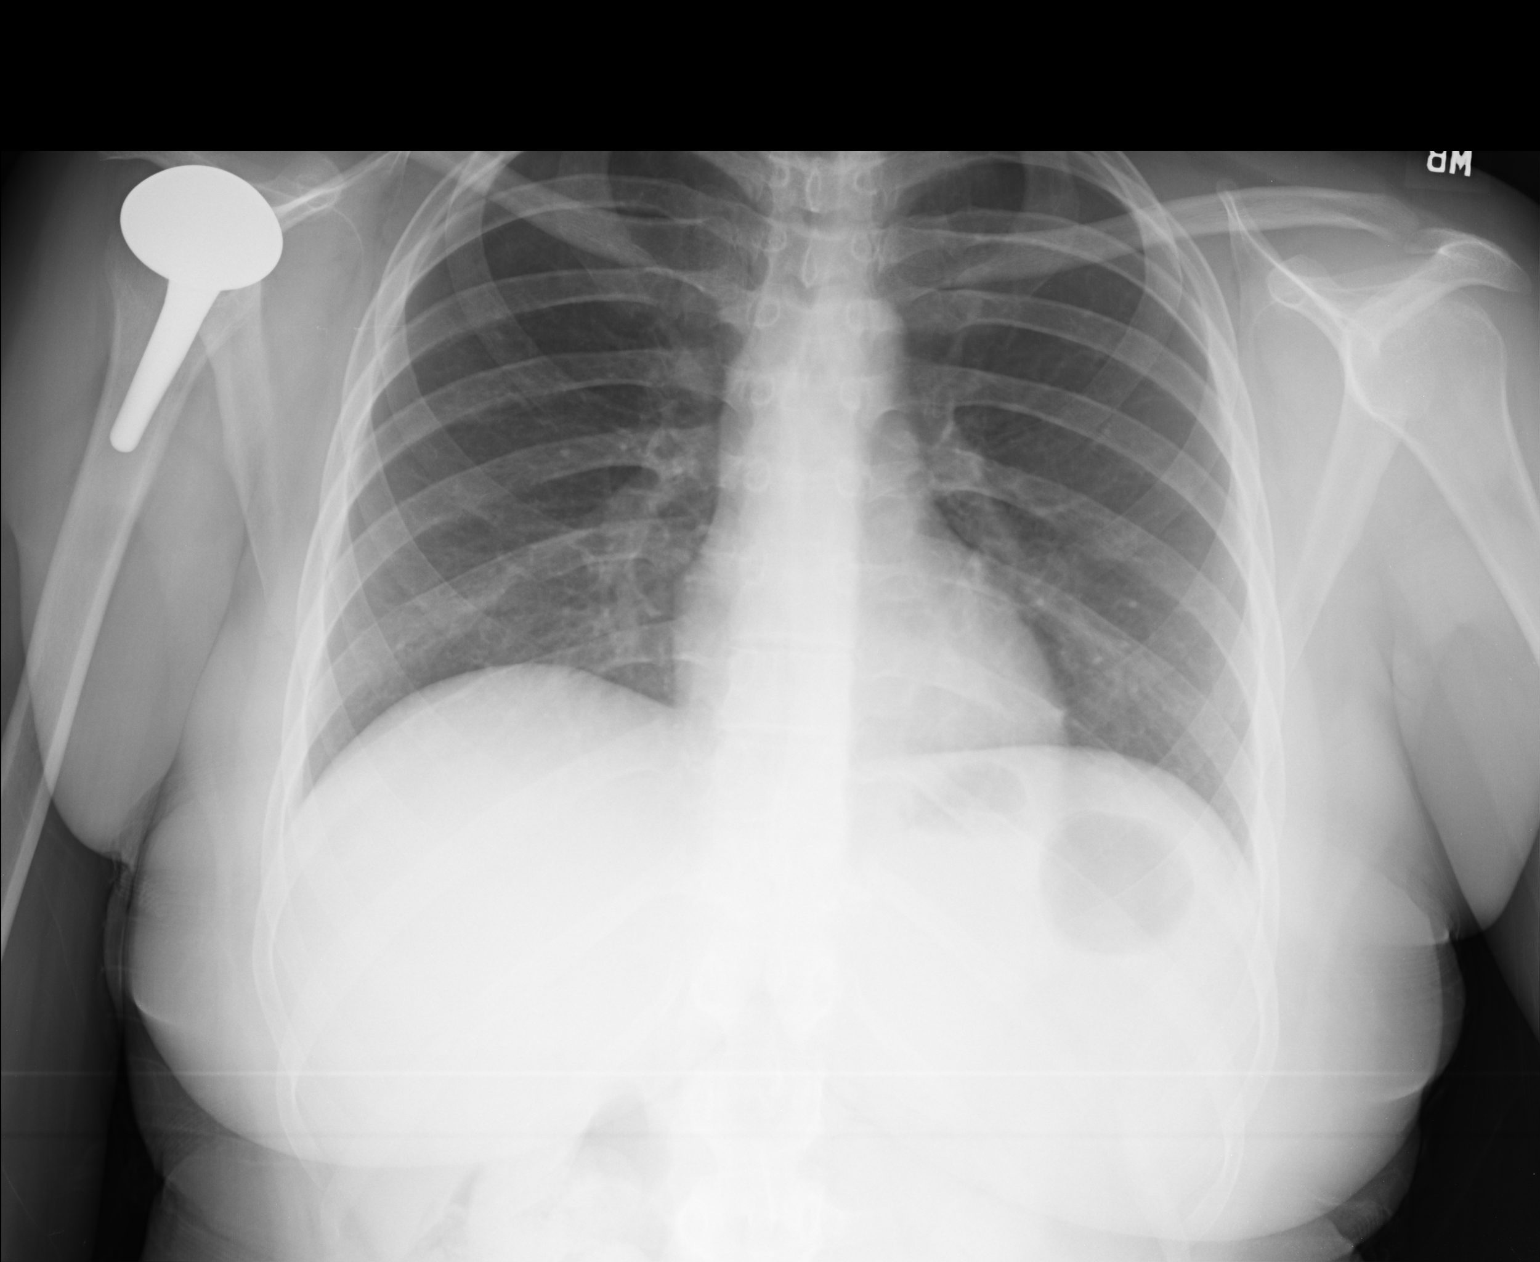

[lateral]
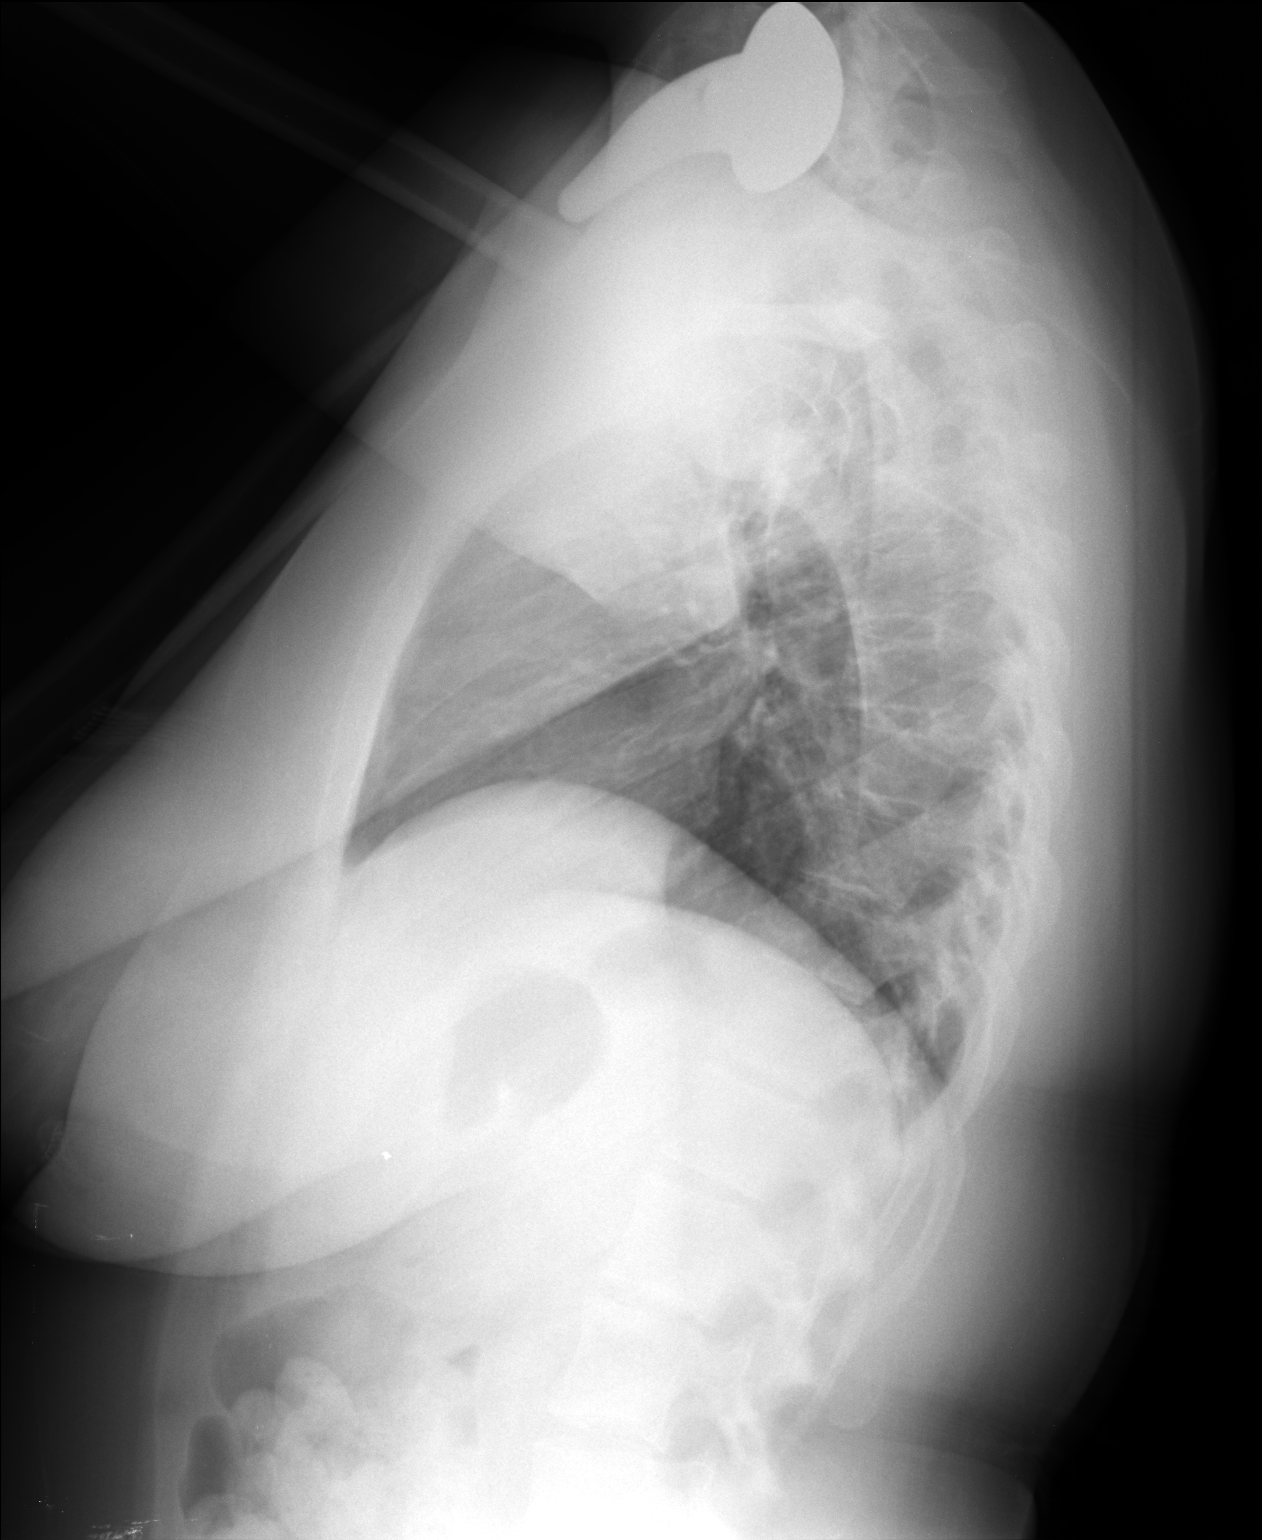

[2 of 2 positions shown; findings below may reference images not displayed]

FINDINGS: The cardiomediastinal contours are normal. The lungs are clear.
Pulmonary vasculature is normal. No consolidation, pleural effusion,
or pneumothorax. No acute osseous abnormalities are seen. Interval
right shoulder hemiarthroplasty.
IMPRESSION: No acute pulmonary process.
